# Patient Record
Sex: Female | Born: 1985 | Race: White | Hispanic: No | Marital: Married | Smoking: Never smoker
Health system: Southern US, Community
[De-identification: ages and names within clinical notes are randomized; demographics above are authoritative.]

## PROBLEM LIST (undated history)

## (undated) DIAGNOSIS — N979 Female infertility, unspecified: Secondary | ICD-10-CM

## (undated) DIAGNOSIS — E282 Polycystic ovarian syndrome: Secondary | ICD-10-CM

## (undated) DIAGNOSIS — F419 Anxiety disorder, unspecified: Secondary | ICD-10-CM

## (undated) HISTORY — PX: ANTERIOR CRUCIATE LIGAMENT REPAIR: SHX115

## (undated) HISTORY — PX: NO PAST SURGERIES: SHX2092

---
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
## 2004-02-17 ENCOUNTER — Emergency Department (HOSPITAL_COMMUNITY): Admission: EM | Admit: 2004-02-17 | Discharge: 2004-02-17 | Payer: Self-pay | Admitting: Emergency Medicine

## 2004-03-04 ENCOUNTER — Other Ambulatory Visit: Admission: RE | Admit: 2004-03-04 | Discharge: 2004-03-04 | Payer: Self-pay | Admitting: Obstetrics and Gynecology

## 2005-03-12 ENCOUNTER — Other Ambulatory Visit: Admission: RE | Admit: 2005-03-12 | Discharge: 2005-03-12 | Payer: Self-pay | Admitting: Obstetrics and Gynecology

## 2006-04-08 ENCOUNTER — Inpatient Hospital Stay (HOSPITAL_COMMUNITY): Admission: AD | Admit: 2006-04-08 | Discharge: 2006-04-08 | Payer: Self-pay | Admitting: Obstetrics and Gynecology

## 2006-05-19 ENCOUNTER — Emergency Department (HOSPITAL_COMMUNITY): Admission: EM | Admit: 2006-05-19 | Discharge: 2006-05-19 | Payer: Self-pay | Admitting: Emergency Medicine

## 2007-11-22 ENCOUNTER — Emergency Department (HOSPITAL_COMMUNITY): Admission: EM | Admit: 2007-11-22 | Discharge: 2007-11-22 | Payer: Self-pay | Admitting: Emergency Medicine

## 2009-02-05 ENCOUNTER — Emergency Department (HOSPITAL_COMMUNITY): Admission: EM | Admit: 2009-02-05 | Discharge: 2009-02-05 | Payer: Self-pay | Admitting: Emergency Medicine

## 2010-10-07 LAB — RAPID STREP SCREEN (MED CTR MEBANE ONLY): Streptococcus, Group A Screen (Direct): NEGATIVE

## 2011-03-05 ENCOUNTER — Other Ambulatory Visit (HOSPITAL_COMMUNITY): Payer: Self-pay | Admitting: Obstetrics and Gynecology

## 2011-03-05 DIAGNOSIS — N979 Female infertility, unspecified: Secondary | ICD-10-CM

## 2011-03-11 ENCOUNTER — Ambulatory Visit (HOSPITAL_COMMUNITY): Payer: Self-pay

## 2011-05-06 ENCOUNTER — Ambulatory Visit: Payer: BC Managed Care – PPO

## 2011-05-06 ENCOUNTER — Ambulatory Visit: Payer: BC Managed Care – PPO | Admitting: Family Medicine

## 2011-05-06 VITALS — BP 124/81 | HR 78 | Temp 98.3°F | Resp 16 | Ht 65.5 in | Wt 233.4 lb

## 2011-05-06 DIAGNOSIS — M25529 Pain in unspecified elbow: Secondary | ICD-10-CM

## 2011-05-06 DIAGNOSIS — M25521 Pain in right elbow: Secondary | ICD-10-CM

## 2011-05-06 MED ORDER — MELOXICAM 7.5 MG PO TABS: 15.0000 mg | ORAL_TABLET | Freq: Every day | ORAL | Status: DC

## 2011-05-06 NOTE — Progress Notes (Signed)
  Patient Name: Colleen Weber Date of Birth: 02-29-1985 Medical Record Number: [id number] Gender: female Date of Encounter: 05/06/2011  History of Present Illness:  Colleen Weber is a 26 y.o. very pleasant female patient who presents with the following:  Here with right lateral/ posterior elbow pain for about 3 weeks.  It hurts the most at night.  Flexing the elbow hurts more, and it can "go numb" if she rests the elbow on a counter.  No unusual activities/ exercise program recently, and she does not have small children to carry.  No known injury.  She has tried bio- freeze, tylenol, motrin- have not helped her yet.   LMP started 04/16/11  There is no problem list on file for this patient.  No past medical history on file. No past surgical history on file. History  Substance Use Topics  . Smoking status: Never Smoker   . Smokeless tobacco: Not on file  . Alcohol Use: No   No family history on file. Allergies  Allergen Reactions  . Amoxicillin Anaphylaxis  . Codeine Itching    Medication list has been reviewed and updated.  Review of Systems: As per HPI- otherwise negative.   Physical Examination: Filed Vitals:   05/06/11 1639  BP: 124/81  Pulse: 78  Temp: 98.3 F (36.8 C)  TempSrc: Oral  Resp: 16  Height: 5' 5.5" (1.664 m)  Weight: 233 lb 6.4 oz (105.87 kg)    Body mass index is 38.25 kg/(m^2).  GEN: WDWN, NAD, Non-toxic, A & O x 3, obese HEENT: Atraumatic, Normocephalic. Neck supple. No masses, No LAD. Ears and Nose: No external deformity. CV: RRR, No M/G/R. No JVD. No thrill. No extra heart sounds. PULM: CTA B, no wheezes, crackles, rhonchi. No retractions. No resp. distress. No accessory muscle use EXTR: No c/c/e NEURO Normal gait.  PSYCH: Normally interactive. Conversant. Not depressed or anxious appearing.  Calm demeanor.  Rt elbow:  No tenderness over lateral or medical epicondyle.  Over the posterior elbow there is a small, palpable, mobile nodule  which seems to be attached to a tendon- pressing on this area causes her to feel some pain and tinging going up her arm. No pain with resisted pronation or supination, flexion or extension. No swelling, redness or heat  [facility] reading (PRIMARY) by  Dr. Patsy Lager.  Negative elbow  Assessment and Plan: 1. Right elbow pain  DG Elbow Complete Right, meloxicam (MOBIC) 7.5 MG tablet, Ambulatory referral to Orthopedic Surgery   Elbow pain of uncertain etiology- she may have a formed a nodule on a tendon.  Will refer to ortho for evaluation- appreciate consult.  May use mobic (instructed her one OR two daily) as needed in the meantime

## 2011-05-17 ENCOUNTER — Other Ambulatory Visit (HOSPITAL_COMMUNITY): Payer: Self-pay | Admitting: Obstetrics and Gynecology

## 2011-05-17 DIAGNOSIS — N979 Female infertility, unspecified: Secondary | ICD-10-CM

## 2011-05-24 ENCOUNTER — Ambulatory Visit (HOSPITAL_COMMUNITY)
Admission: RE | Admit: 2011-05-24 | Discharge: 2011-05-24 | Disposition: A | Discharge status: Home / Self Care | Payer: BC Managed Care – PPO | Source: Ambulatory Visit | Attending: Obstetrics and Gynecology | Admitting: Obstetrics and Gynecology

## 2011-05-24 DIAGNOSIS — N979 Female infertility, unspecified: Secondary | ICD-10-CM | POA: Insufficient documentation

## 2011-05-24 MED ORDER — IOHEXOL 300 MG/ML  SOLN: 9.0000 mL | Freq: Once | INTRAMUSCULAR | Status: AC | PRN

## 2012-01-07 ENCOUNTER — Ambulatory Visit: Payer: BC Managed Care – PPO | Admitting: Physician Assistant

## 2012-01-07 VITALS — BP 131/83 | HR 134 | Temp 100.0°F | Resp 16 | Ht 66.0 in | Wt 231.0 lb

## 2012-01-07 DIAGNOSIS — J111 Influenza due to unidentified influenza virus with other respiratory manifestations: Secondary | ICD-10-CM

## 2012-01-07 DIAGNOSIS — R509 Fever, unspecified: Secondary | ICD-10-CM

## 2012-01-07 DIAGNOSIS — R6889 Other general symptoms and signs: Secondary | ICD-10-CM

## 2012-01-07 LAB — POCT CBC
Hemoglobin: 15.6 g/dL (ref 12.2–16.2)
Lymph, poc: 1.3 (ref 0.6–3.4)
MCH, POC: 30.4 pg (ref 27–31.2)
MCHC: 31.8 g/dL (ref 31.8–35.4)
MID (cbc): 1.1 — AB (ref 0–0.9)
MPV: 9 fL (ref 0–99.8)
POC Granulocyte: 7.8 — AB (ref 2–6.9)
POC LYMPH PERCENT: 12.7 %L (ref 10–50)
POC MID %: 10.9 %M (ref 0–12)
Platelet Count, POC: 332 10*3/uL (ref 142–424)
RDW, POC: 14.1 %
WBC: 10.2 10*3/uL (ref 4.6–10.2)

## 2012-01-07 MED ORDER — OSELTAMIVIR PHOSPHATE 75 MG PO CAPS: 75.0000 mg | ORAL_CAPSULE | Freq: Two times a day (BID) | ORAL | Status: DC

## 2012-01-07 MED ORDER — HYDROCODONE-HOMATROPINE 5-1.5 MG/5ML PO SYRP: 5.0000 mL | ORAL_SOLUTION | Freq: Three times a day (TID) | ORAL | Status: DC | PRN

## 2012-01-07 MED ORDER — IBUPROFEN 200 MG PO TABS: 400.0000 mg | ORAL_TABLET | Freq: Once | ORAL | Status: DC

## 2012-01-07 NOTE — Progress Notes (Signed)
Subjective:    Patient ID: Colleen Weber, female    DOB: 10/16/1985, 27 y.o.   MRN: [id number]  HPI   Colleen Weber is a 27 yr old female here with one day of flu-like symptoms.  Began yesterday with cough around lunch time.  Cough worsened throughout the day.  Cough is non-productive.  Felt like she spiked a fever during the night but didn't check her temperature.  Took some ibuprofen which relieved fever.  Still with temp of 100F in clinic.  Denies headache, runny nose, sore throat, or GI symptoms.  But is having body aches.  Her two nieces have been sick.  No flu shot this season.   Review of Systems  Constitutional: Positive for fever and chills.  HENT: Negative for congestion, sore throat, rhinorrhea and sinus pressure.   Respiratory: Positive for cough. Negative for shortness of breath and wheezing.   Cardiovascular: Negative.   Gastrointestinal: Negative.   Musculoskeletal: Positive for arthralgias.  Skin: Negative.   Neurological: Negative for headaches.       Objective:   Physical Exam  Vitals reviewed. Constitutional: She is oriented to person, place, and time. She appears well-developed and well-nourished. No distress.  HENT:  Head: Normocephalic and atraumatic.  Right Ear: Ear canal normal. Tympanic membrane is erythematous.  Left Ear: Tympanic membrane and ear canal normal.  Nose: Nose normal.  Mouth/Throat: Uvula is midline and mucous membranes are normal. Posterior oropharyngeal erythema present.  Eyes: Conjunctivae normal are normal. No scleral icterus.  Neck: Neck supple.  Cardiovascular: Regular rhythm, normal heart sounds and intact distal pulses.  Tachycardia present.  Exam reveals no gallop and no friction rub.   No murmur heard. Pulmonary/Chest: Effort normal and breath sounds normal. She has no wheezes. She has no rales.  Abdominal: Soft. Bowel sounds are normal.  Lymphadenopathy:    She has no cervical adenopathy.  Neurological: She is alert and oriented  to person, place, and time.  Skin: Skin is warm and dry.  Psychiatric: She has a normal mood and affect. Her behavior is normal.      Filed Vitals:   01/07/12 1340  BP: 131/83  Pulse: 134  Temp: 100 F (37.8 C)  Resp: 16      Results for orders placed in visit on 01/07/12  POCT INFLUENZA A/B      Component Value Range   Influenza A, POC Negative     Influenza B, POC Negative    POCT CBC      Component Value Range   WBC 10.2  4.6 - 10.2 K/uL   Lymph, poc 1.3  0.6 - 3.4   POC LYMPH PERCENT 12.7  10 - 50 %L   MID (cbc) 1.1 (*) 0 - 0.9   POC MID % 10.9  0 - 12 %M   POC Granulocyte 7.8 (*) 2 - 6.9   Granulocyte percent 76.4  37 - 80 %G   RBC 5.14  4.04 - 5.48 M/uL   Hemoglobin 15.6  12.2 - 16.2 g/dL   HCT, POC 40.9 (*) 81.1 - 47.9 %   MCV 95.5  80 - 97 fL   MCH, POC 30.4  27 - 31.2 pg   MCHC 31.8  31.8 - 35.4 g/dL   RDW, POC 91.4     Platelet Count, POC 332  142 - 424 K/uL   MPV 9.0  0 - 99.8 fL       Assessment & Plan:   1. Flu-like symptoms  POCT Influenza A/B, POCT CBC, oseltamivir (TAMIFLU) 75 MG capsule, HYDROcodone-homatropine (HYCODAN) 5-1.5 MG/5ML syrup  2. Fever  ibuprofen (ADVIL,MOTRIN) tablet 400 mg     Colleen Weber is a very pleasant 27 yr old female here with influenza-like illness.  Rapid flu is negative.  CBC is normal.  Given that symptoms began yesterday and that this clinically looks like flu, will treat with Tamiflu.  Pt states that Tessalon does not work for her.  States the only thing that works are hydrocodone syrups.  States she does have itching when she uses this but that it is controlled with Benadryl.  States she has never developed hives, swelling of lips or tongue, or difficulty breathing.  Encouraged her to try OTC Robitussin or Delsym first, if this doesn't work, may try Hycodan which has worked well in the past.  Push fluids, plenty of rest.  Pt will RTC if worsening or not improving.

## 2012-01-07 NOTE — Patient Instructions (Addendum)
Begin Tamiflu today.  Be sure to finish the full course.  Plenty of fluids and rest.  Good hand hygiene.  Cover your mouth when coughing or sneezing.  Try Robitussin or Delsym for cough.  If that is not cutting it, use the Hycodan.  If you are having increased itching, difficulty breather, swelling of lips or tongue, STOP the Hycodan immediately.  Let us know if you are worsening or not improving.     Influenza Facts Flu (influenza) is a contagious respiratory illness caused by the influenza viruses. It can cause mild to severe illness. While most healthy people recover from the flu without specific treatment and without complications, older people, young children, and people with certain health conditions are at higher risk for serious complications from the flu, including death. CAUSES   The flu virus is spread from person to person by respiratory droplets from coughing and sneezing.  A person can also become infected by touching an object or surface with a virus on it and then touching their mouth, eye or nose.  Adults may be able to infect others from 1 day before symptoms occur and up to 7 days after getting sick. So it is possible to give someone the flu even before you know you are sick and continue to infect others while you are sick. SYMPTOMS   Fever (usually high).  Headache.  Tiredness (can be extreme).  Cough.  Sore throat.  Runny or stuffy nose.  Body aches.  Diarrhea and vomiting may also occur, particularly in children.  These symptoms are referred to as "flu-like symptoms". A lot of different illnesses, including the common cold, can have similar symptoms. DIAGNOSIS   There are tests that can determine if you have the flu as long you are tested within the first 2 or 3 days of illness.  A doctor's exam and additional tests may be needed to identify if you have a disease that is a complicating the flu. RISKS AND COMPLICATIONS  Some of the complications caused by the  flu include:  Bacterial pneumonia or progressive pneumonia caused by the flu virus.  Loss of body fluids (dehydration).  Worsening of chronic medical conditions, such as heart failure, asthma, or diabetes.  Sinus problems and ear infections. HOME CARE INSTRUCTIONS   Seek medical care early on.  If you are at high risk from complications of the flu, consult your health-care provider as soon as you develop flu-like symptoms. Those at high risk for complications include:  People 65 years or older.  People with chronic medical conditions, including diabetes.  Pregnant women.  Young children.  Your caregiver may recommend use of an antiviral medication to help treat the flu.  If you get the flu, get plenty of rest, drink a lot of liquids, and avoid using alcohol and tobacco.  You can take over-the-counter medications to relieve the symptoms of the flu if your caregiver approves. (Never give aspirin to children or teenagers who have flu-like symptoms, particularly fever). PREVENTION  The single best way to prevent the flu is to get a flu vaccine each fall. Other measures that can help protect against the flu are:  Antiviral Medications  A number of antiviral drugs are approved for use in preventing the flu. These are prescription medications, and a doctor should be consulted before they are used.  Habits for Good Health  Cover your nose and mouth with a tissue when you cough or sneeze, throw the tissue away after you use it.  Wash your hands often with soap and water, especially after you cough or sneeze. If you are not near water, use an alcohol-based hand cleaner.  Avoid people who are sick.  If you get the flu, stay home from work or school. Avoid contact with other people so that you do not make them sick, too.  Try not to touch your eyes, nose, or mouth as germs ore often spread this way. IN CHILDREN, EMERGENCY WARNING SIGNS THAT NEED URGENT MEDICAL ATTENTION:  Fast  breathing or trouble breathing.  Bluish skin color.  Not drinking enough fluids.  Not waking up or not interacting.  Being so irritable that the child does not want to be held.  Flu-like symptoms improve but then return with fever and worse cough.  Fever with a rash. IN ADULTS, EMERGENCY WARNING SIGNS THAT NEED URGENT MEDICAL ATTENTION:  Difficulty breathing or shortness of breath.  Pain or pressure in the chest or abdomen.  Sudden dizziness.  Confusion.  Severe or persistent vomiting. SEEK IMMEDIATE MEDICAL CARE IF:  You or someone you know is experiencing any of the symptoms above. When you arrive at the emergency center,report that you think you have the flu. You may be asked to wear a mask and/or sit in a secluded area to protect others from getting sick. MAKE SURE YOU:   Understand these instructions.  Monitor your condition.  Seek medical care if you are getting worse, or not improving. Document Released: 12/24/2002 Document Revised: 03/15/2011 Document Reviewed: 09/19/2008 [facility] Patient Information 2013 [place], [place].

## 2012-03-18 ENCOUNTER — Emergency Department (HOSPITAL_COMMUNITY)
Admission: EM | Admit: 2012-03-18 | Discharge: 2012-03-18 | Disposition: A | Discharge status: Home / Self Care | Payer: BC Managed Care – PPO | Attending: Emergency Medicine | Admitting: Emergency Medicine

## 2012-03-18 DIAGNOSIS — F411 Generalized anxiety disorder: Secondary | ICD-10-CM | POA: Insufficient documentation

## 2012-03-18 DIAGNOSIS — Z79899 Other long term (current) drug therapy: Secondary | ICD-10-CM | POA: Insufficient documentation

## 2012-03-18 DIAGNOSIS — R0602 Shortness of breath: Secondary | ICD-10-CM | POA: Insufficient documentation

## 2012-03-18 MED ORDER — LORAZEPAM 1 MG PO TABS: 1.0000 mg | ORAL_TABLET | Freq: Three times a day (TID) | ORAL | Status: DC | PRN

## 2012-03-18 MED ORDER — LORAZEPAM 1 MG PO TABS: 2.0000 mg | ORAL_TABLET | Freq: Once | ORAL | Status: DC

## 2012-03-18 MED ORDER — LORAZEPAM 1 MG PO TABS
1.0000 mg | ORAL_TABLET | Freq: Once | ORAL | Status: AC
  Administered 2012-03-18: 1 mg via ORAL
  Filled 2012-03-18: qty 1

## 2012-03-18 NOTE — ED Provider Notes (Signed)
History     CSN: [id number]  Arrival date & time 03/18/12  0215   First MD Initiated Contact with Patient 03/18/12 0243      Chief Complaint  Patient presents with  . Shortness of Breath  . Anxiety    (Consider location/radiation/quality/duration/timing/severity/associated sxs/prior treatment) HPI Patient reports that her brother-in-law who committed suicide 3 weeks ago.  This was unexpected.  The patient was with the deceased the weekend before.  This is very unexpected.  She's had intermittent episodes of shortness of breath since then with the sense that she needs to take a deep breath.  She feels as though she gets anxious and even small things.  She has not met with a psychiatrist or psychologist.  She has no therapist.  No homicidal or suicidal thoughts.  No cough or congestion.  No chest pain.  No lower extremities swelling.  No history of DVT or pulmonary embolism.  Her symptoms are mild to moderate when they occur.  At this time she feels much better. No past medical history on file.  No past surgical history on file.  No family history on file.  History  Substance Use Topics  . Smoking status: Never Smoker   . Smokeless tobacco: Not on file  . Alcohol Use: No    OB History   Grav Para Term Preterm Abortions TAB SAB Ect Mult Living                  Review of Systems  All other systems reviewed and are negative.    Allergies  Amoxicillin and Codeine  Home Medications   Current Outpatient Rx  Name  Route  Sig  Dispense  Refill  . LORazepam (ATIVAN) 1 MG tablet   Oral   Take 1 tablet (1 mg total) by mouth 3 (three) times daily as needed for anxiety.   15 tablet   0   . metFORMIN (GLUCOPHAGE) 500 MG tablet   Oral   Take 500 mg by mouth 2 (two) times daily with a meal.           BP 161/90  Pulse 103  Temp(Src) 98.1 F (36.7 C) (Oral)  Resp 22  SpO2 100%  LMP 03/05/2012  Physical Exam  Nursing note and vitals reviewed. Constitutional: She is  oriented to person, place, and time. She appears well-developed and well-nourished. No distress.  HENT:  Head: Normocephalic and atraumatic.  Eyes: EOM are normal.  Neck: Normal range of motion.  Cardiovascular: Normal rate, regular rhythm and normal heart sounds.   Pulmonary/Chest: Effort normal and breath sounds normal.  Abdominal: Soft. She exhibits no distension. There is no tenderness.  Musculoskeletal: Normal range of motion.  Neurological: She is alert and oriented to person, place, and time.  Skin: Skin is warm and dry.  Psychiatric: Her mood appears anxious.    ED Course  Procedures (including critical care time)  Labs Reviewed - No data to display No results found.   1. Anxiety       MDM  No HI. Pulse ox 100%. Appears to be anxiety. Outpatient resources given        Lyanne Co, MD 03/18/12 [phone]

## 2012-03-18 NOTE — ED Notes (Signed)
Pt states she has been sob for 3 weeks,  Pt states she had a tragic death in her family 3 weeks ago and ever since she has been nervous and sob

## 2012-03-23 ENCOUNTER — Emergency Department (HOSPITAL_COMMUNITY): Payer: BC Managed Care – PPO

## 2012-03-23 ENCOUNTER — Emergency Department (HOSPITAL_COMMUNITY)
Admission: EM | Admit: 2012-03-23 | Discharge: 2012-03-23 | Disposition: A | Discharge status: Home / Self Care | Payer: BC Managed Care – PPO | Attending: Emergency Medicine | Admitting: Emergency Medicine

## 2012-03-23 ENCOUNTER — Encounter (HOSPITAL_COMMUNITY): Payer: Self-pay | Admitting: *Deleted

## 2012-03-23 DIAGNOSIS — R42 Dizziness and giddiness: Secondary | ICD-10-CM | POA: Insufficient documentation

## 2012-03-23 DIAGNOSIS — Z79899 Other long term (current) drug therapy: Secondary | ICD-10-CM | POA: Insufficient documentation

## 2012-03-23 DIAGNOSIS — F411 Generalized anxiety disorder: Secondary | ICD-10-CM | POA: Insufficient documentation

## 2012-03-23 DIAGNOSIS — R209 Unspecified disturbances of skin sensation: Secondary | ICD-10-CM | POA: Insufficient documentation

## 2012-03-23 HISTORY — DX: Polycystic ovarian syndrome: E28.2

## 2012-03-23 LAB — COMPREHENSIVE METABOLIC PANEL
Albumin: 3.8 g/dL (ref 3.5–5.2)
BUN: 9 mg/dL (ref 6–23)
Creatinine, Ser: 0.65 mg/dL (ref 0.50–1.10)
Total Protein: 7.5 g/dL (ref 6.0–8.3)

## 2012-03-23 LAB — POCT I-STAT TROPONIN I: Troponin i, poc: 0 ng/mL (ref 0.00–0.08)

## 2012-03-23 LAB — CBC
HCT: 42.4 % (ref 36.0–46.0)
MCHC: 34.4 g/dL (ref 30.0–36.0)
MCV: 88.7 fL (ref 78.0–100.0)
RDW: 12.6 % (ref 11.5–15.5)

## 2012-03-23 NOTE — ED Notes (Signed)
Chest view clicked off in error.

## 2012-03-23 NOTE — ED Notes (Signed)
Pt's brother-in-law committed suicide 1 month prior.  Last Friday she went to [facility] b/c she felt like she couldn't breath, felt dizzy and experienced L arm numbness.  They gave her a few pills, and told her it was anxiety.  She has been experiencing the same s/s and wants to be sure that it's not her heart b/c her dad had to have a pace maker when he was young.

## 2012-03-23 NOTE — ED Provider Notes (Signed)
History     CSN: [id number]  Arrival date & time 03/23/12  1027   First MD Initiated Contact with Patient 03/23/12 1203      Chief Complaint  Patient presents with  . Numbness  . Shortness of Breath    (Consider location/radiation/quality/duration/timing/severity/associated sxs/prior treatment) Patient is a 27 y.o. female presenting with shortness of breath.  Shortness of Breath  Pt with no significant PMH was seen about 5 days ago at [facility] for symptoms consistent with panic attack. She was given Ativan Rx. She reports today while working out at [employer], she began to feel the same symptoms but less severe. She describes SOB, arm tingling, and mild dizziness. She is feeling better now. She came to the ED for re-evaluation to make sure these symptoms were not something more serious. No recent travel, no leg swelling. No other PE risk factors.   History reviewed. No pertinent past medical history.  History reviewed. No pertinent past surgical history.  History reviewed. No pertinent family history.  History  Substance Use Topics  . Smoking status: Never Smoker   . Smokeless tobacco: Not on file  . Alcohol Use: No    OB History   Grav Para Term Preterm Abortions TAB SAB Ect Mult Living                  Review of Systems  Respiratory: Positive for shortness of breath.    All other systems reviewed and are negative except as noted in HPI.   Allergies  Amoxicillin and Codeine  Home Medications   Current Outpatient Rx  Name  Route  Sig  Dispense  Refill  . LORazepam (ATIVAN) 1 MG tablet   Oral   Take 1 tablet (1 mg total) by mouth 3 (three) times daily as needed for anxiety.   15 tablet   0   . metFORMIN (GLUCOPHAGE) 500 MG tablet   Oral   Take 500 mg by mouth 2 (two) times daily with a meal.           BP 140/90  Pulse 128  Temp(Src) 98.6 F (37 C) (Oral)  SpO2 96%  LMP 03/13/2012  Physical Exam  Nursing note and vitals reviewed. Constitutional: She  is oriented to person, place, and time. She appears well-developed and well-nourished.  HENT:  Head: Normocephalic and atraumatic.  Eyes: EOM are normal. Pupils are equal, round, and reactive to light.  Neck: Normal range of motion. Neck supple.  Cardiovascular: Normal rate, normal heart sounds and intact distal pulses.   Pulmonary/Chest: Effort normal and breath sounds normal.  Abdominal: Bowel sounds are normal. She exhibits no distension. There is no tenderness.  Musculoskeletal: Normal range of motion. She exhibits no edema and no tenderness.  Neurological: She is alert and oriented to person, place, and time. She has normal strength. No cranial nerve deficit or sensory deficit.  Skin: Skin is warm and dry. No rash noted.  Psychiatric: She has a normal mood and affect.    ED Course  Procedures (including critical care time)  Labs Reviewed  COMPREHENSIVE METABOLIC PANEL - Abnormal; Notable for the following:    Glucose, Bld 102 (*)    All other components within normal limits  CBC  POCT I-STAT TROPONIN I   Dg Chest 2 View  03/23/2012  *RADIOLOGY REPORT*  Clinical Data: Shortness of breath.  CHEST - 2 VIEW  Comparison: None  Findings: The cardiac silhouette, mediastinal and hilar contours are within normal limits.  Low  lung volumes with vascular crowding and streaky basilar atelectasis.  No infiltrates, edema or effusions.  IMPRESSION: Low lung volumes with vascular crowding and streaky bibasilar atelectasis. No definite infiltrates.   Original Report Authenticated By: Rudie Meyer, M.D.      1. Anxiety       MDM   Date: 03/23/2012  Rate: 106  Rhythm:  sinus tachycardia  QRS Axis: normal  Intervals: normal  ST/T Wave abnormalities: normal  Conduction Disutrbances: none  Narrative Interpretation: unremarkable  Pt feeling better at the time of my eval. No longer tachycardic. Doubt CAD or PE. Labs and imaging ordered in triage are pending. Will wait for results.   12:45  PM Labs and imaging normal. Advised to continue ativan as needed. PCP followup.        Charles B. Bernette Mayers, MD 03/23/12 1245

## 2012-03-23 NOTE — ED Notes (Addendum)
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 Patient is sitting on stretcher in no distress at this time. Patient states her father was born with a heart defect and had a pacemaker at an early age.

## 2012-04-23 ENCOUNTER — Encounter (HOSPITAL_COMMUNITY): Payer: Self-pay | Admitting: Emergency Medicine

## 2012-04-23 ENCOUNTER — Emergency Department (HOSPITAL_COMMUNITY): Payer: BC Managed Care – PPO

## 2012-04-23 ENCOUNTER — Emergency Department (HOSPITAL_COMMUNITY)
Admission: EM | Admit: 2012-04-23 | Discharge: 2012-04-23 | Disposition: A | Discharge status: Home / Self Care | Payer: BC Managed Care – PPO | Attending: Emergency Medicine | Admitting: Emergency Medicine

## 2012-04-23 DIAGNOSIS — F419 Anxiety disorder, unspecified: Secondary | ICD-10-CM

## 2012-04-23 DIAGNOSIS — Z8742 Personal history of other diseases of the female genital tract: Secondary | ICD-10-CM | POA: Insufficient documentation

## 2012-04-23 DIAGNOSIS — R000 Tachycardia, unspecified: Secondary | ICD-10-CM | POA: Insufficient documentation

## 2012-04-23 DIAGNOSIS — Z7982 Long term (current) use of aspirin: Secondary | ICD-10-CM | POA: Insufficient documentation

## 2012-04-23 DIAGNOSIS — Z79899 Other long term (current) drug therapy: Secondary | ICD-10-CM | POA: Insufficient documentation

## 2012-04-23 DIAGNOSIS — R071 Chest pain on breathing: Secondary | ICD-10-CM | POA: Insufficient documentation

## 2012-04-23 DIAGNOSIS — F411 Generalized anxiety disorder: Secondary | ICD-10-CM | POA: Insufficient documentation

## 2012-04-23 DIAGNOSIS — R0789 Other chest pain: Secondary | ICD-10-CM

## 2012-04-23 DIAGNOSIS — R002 Palpitations: Secondary | ICD-10-CM

## 2012-04-23 HISTORY — DX: Anxiety disorder, unspecified: F41.9

## 2012-04-23 LAB — POCT I-STAT TROPONIN I: Troponin i, poc: 0 ng/mL (ref 0.00–0.08)

## 2012-04-23 LAB — COMPREHENSIVE METABOLIC PANEL
AST: 26 U/L (ref 0–37)
BUN: 10 mg/dL (ref 6–23)
CO2: 28 mEq/L (ref 19–32)
Calcium: 9.4 mg/dL (ref 8.4–10.5)
Creatinine, Ser: 0.77 mg/dL (ref 0.50–1.10)
GFR calc non Af Amer: >90 mL/min (ref >90)

## 2012-04-23 LAB — CBC
Hemoglobin: 14.5 g/dL (ref 12.0–15.0)
MCHC: 35.6 g/dL (ref 30.0–36.0)
RBC: 4.59 MIL/uL (ref 3.87–5.11)

## 2012-04-23 LAB — TSH: TSH: 1.195 u[IU]/mL (ref 0.350–4.500)

## 2012-04-23 LAB — MAGNESIUM: Magnesium: 1.9 mg/dL (ref 1.5–2.5)

## 2012-04-23 MED ORDER — IBUPROFEN 600 MG PO TABS: 600.0000 mg | ORAL_TABLET | Freq: Four times a day (QID) | ORAL | Status: DC | PRN

## 2012-04-23 MED ORDER — KETOROLAC TROMETHAMINE 30 MG/ML IJ SOLN
30.0000 mg | Freq: Once | INTRAMUSCULAR | Status: AC
  Administered 2012-04-23: 30 mg via INTRAVENOUS
  Filled 2012-04-23: qty 1

## 2012-04-23 MED ORDER — METHOCARBAMOL 500 MG PO TABS: 1000.0000 mg | ORAL_TABLET | Freq: Four times a day (QID) | ORAL | Status: DC | PRN

## 2012-04-23 MED ORDER — LORAZEPAM 2 MG/ML IJ SOLN
1.0000 mg | Freq: Once | INTRAMUSCULAR | Status: AC
Start: 2012-04-23 — End: 2012-04-23
  Administered 2012-04-23: 1 mg via INTRAVENOUS
  Filled 2012-04-23: qty 1

## 2012-04-23 NOTE — ED Provider Notes (Signed)
History     CSN: [id number]  Arrival date & time 04/23/12  1637   First MD Initiated Contact with Patient 04/23/12 1655      Chief Complaint  Patient presents with  . Chest Pain    (Consider location/radiation/quality/duration/timing/severity/associated sxs/prior treatment) HPI  Patient reports several deaths in her family in the past year. Most recently within the past 2 months her husband's brother died. Husband reports he has sleep apnea and sometimes during the night he stops breathing briefly. He reports his wife does not sleep because she is worried something is going to happen to him. She reports today she has had stress over the weekend concerning taking care of 2 small nieces and having company today for Easter. She reports she has been walking up and down the steps at her house frequently today. She states 30 minutes prior to arrival she started getting a discomfort in her right chest with breathing. She describes it as aching and states it comes and goes. She states she feels like her heart is racing and when it races she feels mildly short of breath. She also feels very anxious. She reports her anxiety gets worse at night. She also notices her heart racing when she gets very active. She reports she's been seen for this before in March. On review of her ED record she was seen here March 15 and again March 20 for similar symptoms. She reports that her doctor has been putting her on Ativan but is only giving her 15 tablets all at a time because she was told it was addictive. She states she tries to take it at night to sleep and she also takes a half a tablet during the day for anxiety. She has never been evaluated by psychiatrist or treated for anxiety.   PCP Dr Wynelle Link  Past Medical History  Diagnosis Date  . Polycystic ovarian syndrome   . Anxiety     History reviewed. No pertinent past surgical history.  History reviewed. No pertinent family history. FOP pacemaker at age 4  yo  History  Substance Use Topics  . Smoking status: Never Smoker   . Smokeless tobacco: Not on file  . Alcohol Use: No  lives at home Lives with spouse Stays at home  OB History   Grav Para Term Preterm Abortions TAB SAB Ect Mult Living                  Review of Systems  All other systems reviewed and are negative.    Allergies  Amoxicillin and Codeine  Home Medications   Current Outpatient Rx  Name  Route  Sig  Dispense  Refill  . aspirin 81 MG chewable tablet   Oral   Chew 81 mg by mouth daily.         . Calcium Carb-Cholecalciferol (CALCIUM + D3 PO)   Oral   Take 2 each by mouth daily. Vitamin shoppe brand chews         . LORazepam (ATIVAN) 1 MG tablet   Oral   Take 1 mg by mouth 3 (three) times daily as needed for anxiety.         Marland Kitchen MAGNESIUM-ZINC PO   Oral   Take 3 tablets by mouth daily. RDA recommended dosing         . Multiple Vitamin (MULTIVITAMIN WITH MINERALS) TABS   Oral   Take 1 tablet by mouth daily.           BP  144/77  Pulse 131  Temp(Src) 98.1 F (36.7 C) (Oral)  Resp 13  SpO2 97%  Vital signs normal except tachycardia   Physical Exam  Nursing note and vitals reviewed. Constitutional: She is oriented to person, place, and time. She appears well-developed and well-nourished.  Non-toxic appearance. She does not appear ill. No distress.  HENT:  Head: Normocephalic and atraumatic.  Right Ear: External ear normal.  Left Ear: External ear normal.  Nose: Nose normal. No mucosal edema or rhinorrhea.  Mouth/Throat: Oropharynx is clear and moist and mucous membranes are normal. No dental abscesses or edematous.  Eyes: Conjunctivae and EOM are normal. Pupils are equal, round, and reactive to light.  Neck: Normal range of motion and full passive range of motion without pain. Neck supple.  Cardiovascular: Regular rhythm and normal heart sounds.  Tachycardia present.  Exam reveals no gallop and no friction rub.   No murmur  heard. Pulmonary/Chest: Effort normal and breath sounds normal. No respiratory distress. She has no wheezes. She has no rhonchi. She has no rales. She exhibits no tenderness and no crepitus.    Area of pain noted  Abdominal: Soft. Normal appearance and bowel sounds are normal. She exhibits no distension. There is no tenderness. There is no rebound and no guarding.  Musculoskeletal: Normal range of motion. She exhibits no edema and no tenderness.  Moves all extremities well.   Neurological: She is alert and oriented to person, place, and time. She has normal strength. No cranial nerve deficit.  Skin: Skin is warm, dry and intact. No rash noted. No erythema. No pallor.  Psychiatric: Her speech is normal and behavior is normal. Her mood appears not anxious.  anxious    ED Course  Procedures (including critical care time)  Medications  ketorolac (TORADOL) 30 MG/ML injection 30 mg (30 mg Intravenous Given 04/23/12 1752)  LORazepam (ATIVAN) injection 1 mg (1 mg Intravenous Given 04/23/12 1754)   Pt states her pain is better and she feels better.   We discussed having her doctors check her thyroid test results later this week.  We also discussed seeing a psychiatrist to get better control of her anxiety.  She states the ativan makes her feel drugged over in the morning if she takes it at bedtime.   Results for orders placed during the hospital encounter of 04/23/12  CBC      Result Value Range   WBC 8.5  4.0 - 10.5 K/uL   RBC 4.59  3.87 - 5.11 MIL/uL   Hemoglobin 14.5  12.0 - 15.0 g/dL   HCT 30.8  65.7 - 84.6 %   MCV 88.7  78.0 - 100.0 fL   MCH 31.6  26.0 - 34.0 pg   MCHC 35.6  30.0 - 36.0 g/dL   RDW 96.2  95.2 - 84.1 %   Platelets 260  150 - 400 K/uL  MAGNESIUM      Result Value Range   Magnesium 1.9  1.5 - 2.5 mg/dL  TSH      Result Value Range   TSH 1.195  0.350 - 4.500 uIU/mL  T4, FREE      Result Value Range   Free T4 0.89  0.80 - 1.80 ng/dL  COMPREHENSIVE METABOLIC PANEL       Result Value Range   Sodium 136  135 - 145 mEq/L   Potassium 3.7  3.5 - 5.1 mEq/L   Chloride 100  96 - 112 mEq/L   CO2 28  19 - 32  mEq/L   Glucose, Bld 110 (*) 70 - 99 mg/dL   BUN 10  6 - 23 mg/dL   Creatinine, Ser 3.24  0.50 - 1.10 mg/dL   Calcium 9.4  8.4 - 40.1 mg/dL   Total Protein 6.8  6.0 - 8.3 g/dL   Albumin 3.5  3.5 - 5.2 g/dL   AST 26  0 - 37 U/L   ALT 18  0 - 35 U/L   Alkaline Phosphatase 85  39 - 117 U/L   Total Bilirubin 0.2 (*) 0.3 - 1.2 mg/dL   GFR calc non Af Amer >90  >90 mL/min   GFR calc Af Amer >90  >90 mL/min  POCT I-STAT TROPONIN I      Result Value Range   Troponin i, poc 0.00  0.00 - 0.08 ng/mL   Comment 3            Laboratory interpretation all normal except thyroid tests pending     Dg Chest 2 View  04/23/2012  *RADIOLOGY REPORT*  Clinical Data: Chest pain.  CHEST - 2 VIEW  Comparison: 03/23/2012.  Findings: The cardiac silhouette, mediastinal and hilar contours are normal and stable.  The lungs are clear.  No pleural effusion. The bony thorax is intact.  IMPRESSION: No acute cardiopulmonary findings.   Original Report Authenticated By: Rudie Meyer, M.D.       Date: 04/23/2012  Rate: 109  Rhythm: sinus tachycardia  QRS Axis: normal  Intervals: normal  ST/T Wave abnormalities: normal  Conduction Disutrbances:none  Narrative Interpretation:   Old EKG Reviewed: unchanged from 03/23/2012    1. Chest wall pain   2. Palpitations   3. Anxiety   4. Tachycardia     Discharge Medication List as of 04/23/2012  8:32 PM    START taking these medications   Details  ibuprofen (ADVIL,MOTRIN) 600 MG tablet Take 1 tablet (600 mg total) by mouth every 6 (six) hours as needed for pain., Starting 04/23/2012, Until Discontinued, Print    methocarbamol (ROBAXIN) 500 MG tablet Take 2 tablets (1,000 mg total) by mouth 4 (four) times daily as needed (chest wall pain)., Starting 04/23/2012, Until Discontinued, Print        Plan discharge  Devoria Albe, MD,  FACEP   MDM          Ward Givens, MD 04/23/12 [phone]

## 2012-04-23 NOTE — ED Notes (Signed)
MD Knapp at bedside 

## 2012-04-23 NOTE — ED Notes (Signed)
Pt c/o right sided CP with SOB starting 30 min ago; pt sts hx of anxiety and feels similar

## 2012-05-12 ENCOUNTER — Encounter (HOSPITAL_COMMUNITY): Payer: Self-pay | Admitting: Psychiatry

## 2012-05-12 ENCOUNTER — Ambulatory Visit (INDEPENDENT_AMBULATORY_CARE_PROVIDER_SITE_OTHER): Payer: BC Managed Care – PPO | Admitting: Psychiatry

## 2012-05-12 VITALS — BP 131/86 | HR 77 | Wt 232.0 lb

## 2012-05-12 DIAGNOSIS — E282 Polycystic ovarian syndrome: Secondary | ICD-10-CM

## 2012-05-12 DIAGNOSIS — F419 Anxiety disorder, unspecified: Secondary | ICD-10-CM

## 2012-05-12 DIAGNOSIS — F411 Generalized anxiety disorder: Secondary | ICD-10-CM

## 2012-05-12 MED ORDER — LORAZEPAM 0.5 MG PO TABS: 0.5000 mg | ORAL_TABLET | ORAL | Status: DC | PRN

## 2012-05-12 MED ORDER — FLUOXETINE HCL 10 MG PO CAPS: 10.0000 mg | ORAL_CAPSULE | Freq: Every day | ORAL | Status: DC

## 2012-05-12 NOTE — Progress Notes (Addendum)
Patient ID: Colleen Weber, female   DOB: 03/20/1985, 27 y.o.   MRN: [id number] Psychiatry Assessment Note Chief complaint Anxiety and panic attack.  History of present illness. Is 27 year old Caucasian married unemployed female self referred for seeking treatment of her panic attack and anxiety symptoms.  Patient admitted increase nervousness, panic attack and fear of dying for past few months.  Patient told last 04-24-2022 her cousin died when she was staying with her for vacation. She died due to status epilepticus and patient found her in the sleep.  Patient developed some anxiety and nervousness since then.  She endorse seeing her dead body, images and having vivid dreams however she started to feel better until recently she experience another loss.  Her husband's brother died in Feb 23, 2022.  He committed suicide.  Patient started to have these symptoms more intense and frequent.  She is having panic attack at least 2-3 times a week.  She felt that something is going to happen to her husband.  She's been sleeping only a few hours.  She is waking up in the middle of the night to check if his husband is breathing.  She also admitted crying spells, decreased energy, some mood swings and very emotional.  She started to feel very anxious around crowds.  This easter she had a lot of family members and patient decided to have panic attack and very nervous.  She's been in the emergency room at least 3 times since 2022-02-23.  Most of the time is due to chest pain panic attack and fear that she is going to die.  Her troponin level is okay, her basic CBC and CMP is also normal.  She was given Ativan 1 mg by primary care physician which she takes only as needed.  Patient is very concerned about psychotropic medication.  She does not want to get addicted.  However she felt that Ativan does help.  Patient also has polycystic ovary and she consider herself very emotional and hormonal.   She admitted easily tearful and crying  when she is watching any romantic or sad movie.  She is concerned about her physical health and her husband's life.  She admitted that sometimes angry irritable but denies any violence or aggression.  She denies any active or passive suicidal thoughts.  She denies any recent change in her weight or appetite.  She denies any paranoia or any hallucination.  She denies any PTSD symptoms.  She denies any obsessive-compulsive symptoms. She is not scared to fly however she does not like to fly because she afraid the plane will crash.  She also does not like to swim in the ocean because her cousin drown when she was only 67 years old.  Patient is not taking any antidepressant or antianxiety medication at this time.  Patient endorse or panic attack comes out of the blue and he stays at least 10 minutes.  She usually take deep breathing or Ativan to calm down.  Patient is not seeing any therapist.  Past psychiatric history. Patient has no previous history of psychiatric inpatient treatment or outpatient treatment.  She denied any history of suicidal attempt, paranoia, psychosis or mania.  Patient endorse anxiety since her cousin died due to status epilepticus and patient found her at her place.  History of abuse. She denies any history of physical sexual verbal or emotional abuse.  Family history. Patient endorse father has anxiety and a younger brother has anxiety and depression.  Medical history. Patient is obese  and diagnosed with polycystic ovary disease.  She's taking metformin and multivitamins.  Her OB/GYN is Dr. Arelia Sneddon and her primary care physician is Dr. son.  Her recent blood work while she visited in the emergency room on April 10 was normal.  Patient denies any history of loss of consciousness, TBI, seizures or any head injury.  Psychosocial history. Patient is born and raised in [place].  Her mother lives in [place] and her father lives in [place].  Her parents never  married.  Patient married twice.  Her first marriage ended when her husband cheated her.  She's living with her husband, aunt and grandparents.  She has no children.  Patient is very close to her mother and father and grandparents.  Education and work history.  Patient has high school education.  She's currently not working.  Alcohol and substance use history. Patient denies any history of any illegal substance use.  She drinks alcohol on rare occasions.  She has no history of DWI, tremors shakes withdrawal symptoms or any blackouts.  She does not smoke.  In the past she has smoked marijuana and heavy drinking when she was going through divorce however she stopped after 4 weeks of divorce.  Legal history. The patient has no legal issues.  Review of Systems  Constitutional:       Obesity  HENT: Negative.   Eyes: Negative.   Respiratory: Negative.   Cardiovascular: Positive for palpitations.  Musculoskeletal: Negative.   Neurological: Negative.   Psychiatric/Behavioral: Positive for depression. Negative for suicidal ideas, memory loss and substance abuse. The patient is nervous/anxious and has insomnia.      Mental status examination Patient is a morbidly obese female who is casually dressed and well-groomed.  She is anxious and emotional but relevant in conversation.  Her speech is soft and clear and coherent.  Her thought processes slow but logical linear and goal-directed.  Her attention and concentration is fair.  She described her mood as anxious and nervous and her affect is mood appropriate.  She denies any active or passive suicidal thoughts or homicidal thoughts.  She denies any auditory or visual hallucination.  There were no paranoia or delusion obsession present at this time.  Her psychomotor activity is slightly increased.  She has no tremors or shakes.  Her fund of knowledge is adequate.  She's alert and oriented x3.  Her insight judgment and impulse control is  okay.  Assessment Axis I anxiety disorder NOS, rule out panic disorder Axis II deferred Axis III see medical history Axis IV moderate Axis V 60-65  Plan I review her symptoms, history, current medication and blood results.  I talked at length about starting antianxiety medication to prevent these anxiety attacks and panic attacks.  Patient raised the concern and trying to get pregnant and seeking actively treatment from her OB/GYN.  I discussed in length about teratogenic side effects of medication.  She wants to try medication and I suggested to try Prozac 10 mg which is relatively safe in pregnancy.  However I strongly encouraged her to discuss with her OB/GYN before she started the medication.  We also talk about Ativan which will not be a good choice if she gets pregnant.  Patient currently not pregnant however she like to come off from Ativan once she gets pregnant.  She does not want to use Ativan every day.  However she felt when she takes the Ativan it helps anxiety.  I suggested to take Ativan 0.5  mg rather than 1 mg for episodic anxiety and panic attack.  I suggested take Prozac 10 mg and contact her OB/GYN if she gets pregnant.  I explain in detail the risk and benefits of medication.  We talked about safety plan anytime having active suicidal thoughts or homicidal thoughts continue to call 911 or go to local emergency room.  I would also refer her to [facility] for counseling.  Patient agree with the plan.  I will see him again in 3 weeks.  Time spent 60 minutes.

## 2012-05-18 ENCOUNTER — Encounter (HOSPITAL_COMMUNITY): Payer: Self-pay | Admitting: *Deleted

## 2012-05-18 ENCOUNTER — Emergency Department (HOSPITAL_COMMUNITY): Payer: BC Managed Care – PPO

## 2012-05-18 ENCOUNTER — Emergency Department (HOSPITAL_COMMUNITY)
Admission: EM | Admit: 2012-05-18 | Discharge: 2012-05-19 | Disposition: A | Discharge status: Home / Self Care | Payer: BC Managed Care – PPO | Attending: Emergency Medicine | Admitting: Emergency Medicine

## 2012-05-18 DIAGNOSIS — Z7982 Long term (current) use of aspirin: Secondary | ICD-10-CM | POA: Insufficient documentation

## 2012-05-18 DIAGNOSIS — R002 Palpitations: Secondary | ICD-10-CM

## 2012-05-18 DIAGNOSIS — Z79899 Other long term (current) drug therapy: Secondary | ICD-10-CM | POA: Insufficient documentation

## 2012-05-18 DIAGNOSIS — F411 Generalized anxiety disorder: Secondary | ICD-10-CM | POA: Insufficient documentation

## 2012-05-18 DIAGNOSIS — Z3202 Encounter for pregnancy test, result negative: Secondary | ICD-10-CM | POA: Insufficient documentation

## 2012-05-18 DIAGNOSIS — Z862 Personal history of diseases of the blood and blood-forming organs and certain disorders involving the immune mechanism: Secondary | ICD-10-CM | POA: Insufficient documentation

## 2012-05-18 DIAGNOSIS — Z8639 Personal history of other endocrine, nutritional and metabolic disease: Secondary | ICD-10-CM | POA: Insufficient documentation

## 2012-05-18 LAB — POCT I-STAT TROPONIN I: Troponin i, poc: 0.01 ng/mL (ref 0.00–0.08)

## 2012-05-18 LAB — CBC
MCH: 32 pg (ref 26.0–34.0)
MCHC: 36.3 g/dL — ABNORMAL HIGH (ref 30.0–36.0)
Platelets: 311 10*3/uL (ref 150–400)

## 2012-05-18 LAB — BASIC METABOLIC PANEL
Calcium: 9.7 mg/dL (ref 8.4–10.5)
GFR calc non Af Amer: >90 mL/min (ref >90)
Sodium: 137 mEq/L (ref 135–145)

## 2012-05-18 NOTE — ED Notes (Addendum)
Cold sensation started on rt. Side of chest, and radiating to lt. Side of upper chest. Pt. Is under a lot of stress. Few mos. Ago family crisis. Before sensation, pt. Had sm. Cp. No sob, no diaphoresis; some tightness. Took 0.5 mg of ativan x 2 pills prior to arrival.

## 2012-05-18 NOTE — ED Notes (Addendum)
Pt reports onset of palpitations with cold senstation in chest began at approx 2130

## 2012-05-19 NOTE — ED Provider Notes (Signed)
History     CSN: [id number]  Arrival date & time 05/18/12  2233   First MD Initiated Contact with Patient 05/18/12 2259      Chief Complaint  Patient presents with  . Palpitations  . Chest Pain    (Consider location/radiation/quality/duration/timing/severity/associated sxs/prior treatment) HPI 27 year old female presents to emergency apartment with complaint of palpitations and cold sensation in the top of her chest.  Patient reports her symptoms started around 9:30 tonight, as she was laying down in bed.  Patient started with a chest discomfort, initially, followed by palpitations, and then a very cold sensation across the top of her chest.  Patient has history of anxiety, she is currently on Xanax, and Prozac.  She's been on Prozac for 2 weeks.  Patient reports she is having some anxiety associated with her palpitations, but does not feel that she is having a full-blown panic attack.  No fevers no chills.  No cough no shortness of breath.  No leg swelling.  Patient has had 3 prior evaluations in the emergency department for chest symptoms and palpitations each time.  Patient has presented to the emergency room with tachycardia.  She denies any ingestions or any other new medications.  Past Medical History  Diagnosis Date  . Polycystic ovarian syndrome   . Anxiety     History reviewed. No pertinent past surgical history.  Family History  Problem Relation Age of Onset  . Anxiety disorder Mother   . Anxiety disorder Brother     History  Substance Use Topics  . Smoking status: Never Smoker   . Smokeless tobacco: Not on file  . Alcohol Use: No    OB History   Grav Para Term Preterm Abortions TAB SAB Ect Mult Living                  Review of Systems  All other systems reviewed and are negative.   other than listed in history of present illness  Allergies  Amoxicillin and Codeine  Home Medications   Current Outpatient Rx  Name  Route  Sig  Dispense  Refill  .  aspirin 81 MG chewable tablet   Oral   Chew 81 mg by mouth daily.         . Calcium Carb-Cholecalciferol (CALCIUM + D3 PO)   Oral   Take 2 tablets by mouth daily. Vitamin shoppe brand chews         . diphenhydrAMINE (BENADRYL) 25 mg capsule   Oral   Take 50 mg by mouth every 6 (six) hours as needed for itching.         Marland Kitchen FLUoxetine (PROZAC) 10 MG capsule   Oral   Take 1 capsule (10 mg total) by mouth daily.   30 capsule   0   . ibuprofen (ADVIL,MOTRIN) 600 MG tablet   Oral   Take 1 tablet (600 mg total) by mouth every 6 (six) hours as needed for pain.   40 tablet   0   . LORazepam (ATIVAN) 0.5 MG tablet   Oral   Take 0.5 mg by mouth daily as needed for anxiety.         Marland Kitchen MAGNESIUM-ZINC PO   Oral   Take 3 tablets by mouth daily. RDA recommended dosing         . metFORMIN (GLUCOPHAGE) 500 MG tablet   Oral   Take 500 mg by mouth daily with breakfast.          . Multiple  Vitamin (MULTIVITAMIN WITH MINERALS) TABS   Oral   Take 1 tablet by mouth daily.           BP 148/91  Pulse 125  Temp(Src) 98.4 F (36.9 C) (Oral)  SpO2 99%  LMP 05/01/2012  Physical Exam  Nursing note and vitals reviewed. Constitutional: She is oriented to person, place, and time. She appears well-developed and well-nourished. No distress.  HENT:  Head: Normocephalic and atraumatic.  Nose: Nose normal.  Mouth/Throat: Oropharynx is clear and moist.  Eyes: Conjunctivae and EOM are normal. Pupils are equal, round, and reactive to light.  Neck: Normal range of motion. Neck supple. No JVD present. No tracheal deviation present. No thyromegaly present.  Cardiovascular: Regular rhythm, normal heart sounds and intact distal pulses.  Exam reveals no gallop and no friction rub.   No murmur heard. Tachycardia  Pulmonary/Chest: Effort normal and breath sounds normal. No stridor. No respiratory distress. She has no wheezes. She has no rales. She exhibits no tenderness.  Abdominal: Soft.  Bowel sounds are normal. She exhibits no distension and no mass. There is no tenderness. There is no rebound and no guarding.  Musculoskeletal: Normal range of motion. She exhibits no edema and no tenderness.  Lymphadenopathy:    She has no cervical adenopathy.  Neurological: She is alert and oriented to person, place, and time. She exhibits normal muscle tone. Coordination normal.  Skin: Skin is warm and dry. No rash noted. She is not diaphoretic. No erythema. No pallor.  Psychiatric: She has a normal mood and affect. Her behavior is normal. Judgment and thought content normal.    ED Course  Procedures (including critical care time)  Labs Reviewed  CBC - Abnormal; Notable for the following:    WBC 12.7 (*)    Hemoglobin 15.4 (*)    MCHC 36.3 (*)    All other components within normal limits  BASIC METABOLIC PANEL - Abnormal; Notable for the following:    Glucose, Bld 114 (*)    All other components within normal limits  POCT I-STAT TROPONIN I  POCT PREGNANCY, URINE   Dg Chest 2 View  05/18/2012   *RADIOLOGY REPORT*  Clinical Data: Palpitations, chest pain  CHEST - 2 VIEW  Comparison: 04/23/2012  Findings: Lungs are clear. No pleural effusion or pneumothorax. The cardiomediastinal contours are within normal limits. The visualized bones and soft tissues are without significant appreciable abnormality.  IMPRESSION: No radiographic evidence of acute cardiopulmonary process.   Original Report Authenticated By: Jearld Lesch, M.D.    Date: 05/19/2012  Rate: 123  Rhythm: sinus tachycardia  QRS Axis: normal  Intervals: normal  ST/T Wave abnormalities: normal  Conduction Disutrbances:none  Narrative Interpretation:   Old EKG Reviewed: unchanged    1. Rapid palpitations       MDM  27 year old female with cold sensation to the upper part of her chest along with palpitations.  Her workup here is been unremarkable.  I do not have a good explanation for her cold sensation.  We'll  refer her back to her primary care Dr. for further management      [name], MD 05/19/12 [phone]

## 2012-05-19 NOTE — ED Notes (Signed)
PT comfortable with d/c and f/u instructions.

## 2012-06-02 ENCOUNTER — Encounter (HOSPITAL_COMMUNITY): Payer: Self-pay | Admitting: Psychiatry

## 2012-06-02 ENCOUNTER — Ambulatory Visit (INDEPENDENT_AMBULATORY_CARE_PROVIDER_SITE_OTHER): Payer: BC Managed Care – PPO | Admitting: Psychiatry

## 2012-06-02 VITALS — BP 130/90 | HR 75 | Ht 66.0 in | Wt 232.0 lb

## 2012-06-02 DIAGNOSIS — F419 Anxiety disorder, unspecified: Secondary | ICD-10-CM

## 2012-06-02 DIAGNOSIS — F411 Generalized anxiety disorder: Secondary | ICD-10-CM

## 2012-06-02 MED ORDER — FLUOXETINE HCL 20 MG PO CAPS: 20.0000 mg | ORAL_CAPSULE | Freq: Every day | ORAL | Status: DC

## 2012-06-02 NOTE — Progress Notes (Signed)
[facility] [id number] Progress Note  Colleen Weber [id number] 27 y.o.  06/02/2012 9:59 AM  Chief Complaint: I still feel anxious.  I'm not sure if the medicine is working.  History of Present Illness: Patient is 27 year old Caucasian married unemployed female who came for her followup appointment.  She was seen first time on May 30 for initial evaluation.  She was experiencing anxiety symptoms.  She was started on Prozac 10 mg.  Patient complaining of cold sweats and palpitation that requires emergency visit 2 weeks ago.  However she was recommended to see psychiatrist.  She was also found UTI and she recently finished antibiotic.  Patient felt initially that it could be due to Prozac however she did not stop the Prozac.  No patient is doing better .  She do not have any close sweats or palpitation but she still feels anxious and nervous.  She denies any major panic attack.  She denies any recent crying spells but she continued to endorse emotional .  She sleeping better.  She admitted that she is not waking up in the middle of the night to check her husband's breathing.  She wants to continue Prozac .  Patient is not planning to get pregnant at this time.  Her husband requires left ankle surgery which may requires six-month rest.  Patient is not drinking or using any illegal substance.  She denies any aggression violence or any active or passive suicidal thoughts.  She is scheduled to see Belenda Cruise in 2 weeks.  Patient denies any tremors shakes .  Her appetite and weight is unchanged from the past.    Suicidal Ideation: No Plan Formed: No Patient has means to carry out plan: No  Homicidal Ideation: No Plan Formed: No Patient has means to carry out plan: No  Review of Systems  Constitutional: Negative for weight loss.  HENT: Negative.   Eyes: Negative.   Respiratory: Negative.   Cardiovascular: Positive for palpitations.  Musculoskeletal: Negative.   Skin: Negative.    Neurological: Negative.   Psychiatric/Behavioral: Negative for depression, suicidal ideas and memory loss. The patient is nervous/anxious and has insomnia.     Psychiatric: Agitation: No Hallucination: No Depressed Mood: No Insomnia: No Hypersomnia: No Altered Concentration: No Feels Worthless: No Grandiose Ideas: No Belief In Special Powers: No New/Increased Substance Abuse: No Compulsions: No  Neurologic: Headache: No Seizure: No Paresthesias: No  Medical History:  Patient has obesity and polycystic ovary disease.  She's taking metformin and multivitamin.  Her OB/GYN is Dr. Arelia Sneddon and a private physician is Dr. Shari Heritage.  She has no history of traumatic brain injury, seizures or any loss of consciousness.  History of abuse.  She denies any history of physical sexual verbal or emotional abuse.   Family history.  Patient endorse father has anxiety and a younger brother has anxiety and depression.   Psychosocial history.  Patient is born and raised in [place]. Her mother lives in [place] and her father lives in [place]. Her parents never married. Patient married twice. Her first marriage ended when her husband cheated her. She's living with her husband, aunt and grandparents. She has no children. Patient is very close to her mother and father and grandparents.  Education and work history. Patient has high school education. She's currently not working.  Alcohol and substance use history.  Patient denies any history of any illegal substance use. She drinks alcohol on rare occasions. She has no history of DWI, tremors shakes withdrawal  symptoms or any blackouts. She does not smoke. In the past she has smoked marijuana and heavy drinking when she was going through divorce however she stopped after 4 weeks of divorce.  Outpatient Encounter Prescriptions as of 06/02/2012  Medication Sig Dispense Refill  . aspirin 81 MG chewable tablet Chew 81 mg by mouth daily.       . Calcium Carb-Cholecalciferol (CALCIUM + D3 PO) Take 2 tablets by mouth daily. Vitamin shoppe brand chews      . ibuprofen (ADVIL,MOTRIN) 600 MG tablet Take 1 tablet (600 mg total) by mouth every 6 (six) hours as needed for pain.  40 tablet  0  . LORazepam (ATIVAN) 0.5 MG tablet Take 0.5 mg by mouth daily as needed for anxiety.      Marland Kitchen MAGNESIUM-ZINC PO Take 3 tablets by mouth daily. RDA recommended dosing      . metFORMIN (GLUCOPHAGE) 500 MG tablet Take 500 mg by mouth daily with breakfast.       . Multiple Vitamin (MULTIVITAMIN WITH MINERALS) TABS Take 1 tablet by mouth daily.      . [DISCONTINUED] FLUoxetine (PROZAC) 10 MG capsule Take 1 capsule (10 mg total) by mouth daily.  30 capsule  0  . diphenhydrAMINE (BENADRYL) 25 mg capsule Take 50 mg by mouth every 6 (six) hours as needed for itching.      Marland Kitchen FLUoxetine (PROZAC) 20 MG capsule Take 1 capsule (20 mg total) by mouth daily.  30 capsule  0   No facility-administered encounter medications on file as of 06/02/2012.    Past Psychiatric History/Hospitalization(s): Patient denies any history of psychiatric inpatient treatment or any suicidal attempt.  She has no history of paranoia, psychosis, mania posttraumatic stress symptoms.  She started have anxiety symptoms and her cousin died due to status epilepticus and patient found her why she was vacationing at her home. Anxiety: Yes Bipolar Disorder: No Depression: Yes Mania: No Psychosis: No Schizophrenia: No Personality Disorder: No Hospitalization for psychiatric illness: No History of Electroconvulsive Shock Therapy: No Prior Suicide Attempts: No  Physical Exam: Constitutional:  BP 130/90  Pulse 75  Ht 5\' 6"  (1.676 m)  Wt 232 lb (105.235 kg)  BMI 37.46 kg/m2  LMP 05/01/2012  General Appearance: well nourished and obese  Musculoskeletal: Strength & Muscle Tone: within normal limits Gait & Station: normal Patient leans: N/A  Psychiatric: Speech (describe rate, volume,  coherence, spontaneity, and abnormalities if any): Clear and coherent.  Normal volume tone and rhythm.  Thought Process (describe rate, content, abstract reasoning, and computation): Logical and goal-directed.  Associations: Relevant and Intact  Thoughts: normal  Mental Status: Orientation: oriented to person, place, time/date and situation Mood & Affect: anxiety and Emotional Attention Span & Concentration: Fair  Medical Decision Making (Choose Three): Established Problem, Stable/Improving (1), Review of Psycho-Social Stressors (1), Review or order clinical lab tests (1), Review of Last Therapy Session (1), Review of Medication Regimen & Side Effects (2) and Review of New Medication or Change in Dosage (2)  Assessment: Axis I: Anxiety disorder NOS  Axis II: Deferred  Axis III:  Patient Active Problem List   Diagnosis Date Noted  . PCO (polycystic ovaries) 05/12/2012  . Anxiety      Axis IV: Moderate  Axis V: 60-65   Plan:  I review her symptoms, recent emergency visit discharge summary, blood work and response to Prozac.  I do believe patient requires a higher dose of Prozac.  She's tolerating the medication better now.  I discussed  in length the risk and benefits of medication.  She is scheduled to see therapist in 3 weeks.  I recommend to call us back if she is a question of conservatively worsening of the symptom.  At this time patient is not planning to get pregnant however I encourage her if she ever decided to get pregnant that she should contact her OB/GYN and this Clinical research associate.  Time spent 30 minutes.  More than 50% of the time spent in psychoeducation counseling and coordination of care.  Lajune Perine T., MD 06/02/2012

## 2012-06-15 ENCOUNTER — Ambulatory Visit (HOSPITAL_COMMUNITY): Payer: Self-pay | Admitting: Licensed Clinical Social Worker

## 2012-06-21 ENCOUNTER — Encounter (HOSPITAL_COMMUNITY): Payer: Self-pay | Admitting: Licensed Clinical Social Worker

## 2012-06-21 ENCOUNTER — Ambulatory Visit (INDEPENDENT_AMBULATORY_CARE_PROVIDER_SITE_OTHER): Payer: BC Managed Care – PPO | Admitting: Licensed Clinical Social Worker

## 2012-06-21 DIAGNOSIS — F411 Generalized anxiety disorder: Secondary | ICD-10-CM

## 2012-06-21 NOTE — Progress Notes (Signed)
Patient ID: Colleen Weber, female   DOB: 01/29/1985, 27 y.o.   MRN: [id number] Patient:   Colleen Weber   DOB:   Feb 26, 1985  MR Number:  [id number]  Location:  [facility] [place] [id number] [place] [address] Dept: [phone]           Date of Service:   06/21/2012    Start Time:   9:30am End Time:   10:20am  Provider/Observer:  Geanie Berlin LCSW       Billing Code/Service: [phone]  Chief Complaint:     Chief Complaint  Patient presents with  . Anxiety    sleep poor, appetite wnl  . Panic Attack  . Trauma  . Agitation    tearfulness    Reason for Service:  Patient is referred by Dr. Lolly Mustache for the treatment of anxiety.   Current Status:  Patient presents with anxious mood and affect. She reports high levels of anxiety with panic attacks since February after her brother in law committed suicide. A year prior to that event, she found her cousin dead. She reports that the suicide began racing and irrational thoughts, generalized anxiety and specific fear that her husband would be killed on his job. She denies any nightmares, but does have difficulty falling alseep. Her appetite is wnl. She denies depression, mania, ocd, AH, VH or paranoia. She is happily married and has been trying to get pregnant over the past year without success. She endorses frustration over this and fear that she will have to take Clomid again, which made her angry and depressed. She has no psychiatric history and has stopped taking Prozac because it increased her anxiety. She denies any suicidal or homicidal ideation, intent or plan. She has a good support network.   Reliability of Information: good  Behavioral Observation: Colleen Weber  presents as a 27 y.o.-year-old  Caucasian Female who appeared her stated age. her dress was Appropriate and she was Fairly Groomed and her manners were Appropriate to the  situation.  There were not any physical disabilities noted.  she displayed an appropriate level of cooperation and motivation.    Interactions:    Active   Attention:   normal  Memory:   normal  Visuo-spatial:   normal  Speech (Volume):  normal  Speech:   normal pitch and normal volume  Thought Process:  Coherent and Relevant  Though Content:  WNL  Orientation:   person, place and time/date  Judgment:   Good  Planning:   Good  Affect:    Anxious  Mood:    Anxious  Insight:   Good  Intelligence:   normal  Marital Status/Living: Married three years. Lives with grandparents and aunt. No children. Trying to get pregnant.   Current Employment: Not working.   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   Substance Use:  No concerns of substance abuse are reported.    Education:   11th   Medical History:   Past Medical History  Diagnosis Date  . Polycystic ovarian syndrome   . Anxiety         Outpatient Encounter Prescriptions as of 06/21/2012  Medication Sig Dispense Refill  . aspirin 81 MG chewable tablet Chew 81 mg by mouth daily.      . Calcium Carb-Cholecalciferol (CALCIUM + D3 PO) Take 2 tablets by mouth daily. Vitamin shoppe brand chews      . LORazepam (ATIVAN) 0.5  MG tablet Take 0.5 mg by mouth daily as needed for anxiety.      . Multiple Vitamin (MULTIVITAMIN WITH MINERALS) TABS Take 1 tablet by mouth daily.      . diphenhydrAMINE (BENADRYL) 25 mg capsule Take 50 mg by mouth every 6 (six) hours as needed for itching.      Marland Kitchen FLUoxetine (PROZAC) 20 MG capsule Take 1 capsule (20 mg total) by mouth daily.  30 capsule  0  . ibuprofen (ADVIL,MOTRIN) 600 MG tablet Take 1 tablet (600 mg total) by mouth every 6 (six) hours as needed for pain.  40 tablet  0  . MAGNESIUM-ZINC PO Take 3 tablets by mouth daily. RDA recommended dosing      . metFORMIN (GLUCOPHAGE) 500 MG tablet Take 500 mg by mouth daily with breakfast.        No  facility-administered encounter medications on file as of 06/21/2012.          Sexual History:   History  Sexual Activity  . Sexually Active: Yes  . Birth Control/ Protection: None    Abuse/Trauma History: Found cousin deceased and brother in law committed suicide.   Psychiatric History:  None.   Family Med/Psych History:  Family History  Problem Relation Age of Onset  . Anxiety disorder Mother   . Anxiety disorder Brother     Risk of Suicide/Violence: virtually non-existent   Impression/DX:  Generalized anxiety disorder  Disposition/Plan:  Bi weekly treatment to address generalized anxiety and process unresolved grief.   Diagnosis:    Axis I: Generalized anxiety disorder       Axis II: Deferred       Axis III:  none      Axis IV:  problems related to social environment          Axis V:  61-70 mild symptoms

## 2012-06-22 ENCOUNTER — Ambulatory Visit (HOSPITAL_COMMUNITY): Payer: Self-pay | Admitting: Licensed Clinical Social Worker

## 2012-06-29 ENCOUNTER — Ambulatory Visit (HOSPITAL_COMMUNITY): Payer: Self-pay | Admitting: Psychiatry

## 2012-07-06 ENCOUNTER — Encounter (HOSPITAL_COMMUNITY): Payer: Self-pay | Admitting: Licensed Clinical Social Worker

## 2012-07-06 ENCOUNTER — Ambulatory Visit (HOSPITAL_COMMUNITY): Payer: Self-pay | Admitting: Licensed Clinical Social Worker

## 2012-07-06 NOTE — Progress Notes (Signed)
Patient ID: Colleen Weber, female   DOB: 1985/06/15, 27 y.o.   MRN: [id number] Patient cancelled late for today's appointment.

## 2012-07-18 ENCOUNTER — Emergency Department (HOSPITAL_COMMUNITY)
Admission: EM | Admit: 2012-07-18 | Discharge: 2012-07-18 | Disposition: A | Discharge status: Home / Self Care | Payer: BC Managed Care – PPO | Attending: Emergency Medicine | Admitting: Emergency Medicine

## 2012-07-18 ENCOUNTER — Encounter (HOSPITAL_COMMUNITY): Payer: Self-pay | Admitting: Emergency Medicine

## 2012-07-18 DIAGNOSIS — Z79899 Other long term (current) drug therapy: Secondary | ICD-10-CM | POA: Insufficient documentation

## 2012-07-18 DIAGNOSIS — Z885 Allergy status to narcotic agent status: Secondary | ICD-10-CM | POA: Insufficient documentation

## 2012-07-18 DIAGNOSIS — Z7982 Long term (current) use of aspirin: Secondary | ICD-10-CM | POA: Insufficient documentation

## 2012-07-18 DIAGNOSIS — M79609 Pain in unspecified limb: Secondary | ICD-10-CM | POA: Insufficient documentation

## 2012-07-18 DIAGNOSIS — R059 Cough, unspecified: Secondary | ICD-10-CM | POA: Insufficient documentation

## 2012-07-18 DIAGNOSIS — M545 Low back pain, unspecified: Secondary | ICD-10-CM | POA: Insufficient documentation

## 2012-07-18 DIAGNOSIS — R05 Cough: Secondary | ICD-10-CM | POA: Insufficient documentation

## 2012-07-18 DIAGNOSIS — R209 Unspecified disturbances of skin sensation: Secondary | ICD-10-CM | POA: Insufficient documentation

## 2012-07-18 DIAGNOSIS — R3915 Urgency of urination: Secondary | ICD-10-CM | POA: Insufficient documentation

## 2012-07-18 DIAGNOSIS — Z3202 Encounter for pregnancy test, result negative: Secondary | ICD-10-CM | POA: Insufficient documentation

## 2012-07-18 DIAGNOSIS — R252 Cramp and spasm: Secondary | ICD-10-CM | POA: Insufficient documentation

## 2012-07-18 DIAGNOSIS — F411 Generalized anxiety disorder: Secondary | ICD-10-CM | POA: Insufficient documentation

## 2012-07-18 DIAGNOSIS — E282 Polycystic ovarian syndrome: Secondary | ICD-10-CM | POA: Insufficient documentation

## 2012-07-18 DIAGNOSIS — Z881 Allergy status to other antibiotic agents status: Secondary | ICD-10-CM | POA: Insufficient documentation

## 2012-07-18 LAB — URINE MICROSCOPIC-ADD ON

## 2012-07-18 LAB — POCT I-STAT, CHEM 8
BUN: 7 mg/dL (ref 6–23)
Chloride: 102 mEq/L (ref 96–112)
Creatinine, Ser: 0.7 mg/dL (ref 0.50–1.10)
Sodium: 141 mEq/L (ref 135–145)
TCO2: 26 mmol/L (ref 0–100)

## 2012-07-18 LAB — URINALYSIS, ROUTINE W REFLEX MICROSCOPIC
Bilirubin Urine: NEGATIVE
Glucose, UA: NEGATIVE mg/dL
Hgb urine dipstick: NEGATIVE
Protein, ur: NEGATIVE mg/dL
Specific Gravity, Urine: 1.019 (ref 1.005–1.030)
Urobilinogen, UA: 0.2 mg/dL (ref 0.0–1.0)

## 2012-07-18 MED ORDER — IBUPROFEN 800 MG PO TABS
800.0000 mg | ORAL_TABLET | Freq: Once | ORAL | Status: AC
  Administered 2012-07-18: 800 mg via ORAL
  Filled 2012-07-18: qty 1

## 2012-07-18 MED ORDER — HYDROCODONE-ACETAMINOPHEN 5-325 MG PO TABS: 1.0000 | ORAL_TABLET | ORAL | Status: DC | PRN

## 2012-07-18 MED ORDER — CYCLOBENZAPRINE HCL 10 MG PO TABS: 10.0000 mg | ORAL_TABLET | Freq: Three times a day (TID) | ORAL | Status: DC | PRN

## 2012-07-18 NOTE — ED Provider Notes (Signed)
History    CSN: [id number] Arrival date & time 07/18/12  0120  First MD Initiated Contact with Patient 07/18/12 [phone]     Chief Complaint  Patient presents with  . Leg Pain   HPI  History provided by the patient. Patient is a 27 year old female with history of a polycystic ovary disease and anxiety who presents with complaints of pain and numbness to her right lower lateral leg and foot. Symptoms have been present for the past 2 days. Initially pain was intermittent cramping pain around the calf has become more constant soreness. This is associated with some numbness to the lateral aspect of the calf and leg into the fourth and fifth toes. There has been no swelling or redness of the skin. She denies similar symptoms previously. The patient does mention that she is currently finishing treatment for a urinary tract infection. She continues to feel some urinary urgency despite taking antibiotics for the past 9 days. She also has occasional pressure low back. She denies any recent injuries or trauma. Denies any fever, chills or sweats. Denies any other aggravating or alleviating factors. Denies any other associated symptoms.    Past Medical History  Diagnosis Date  . Polycystic ovarian syndrome   . Anxiety    History reviewed. No pertinent past surgical history. Family History  Problem Relation Age of Onset  . Anxiety disorder Mother   . Anxiety disorder Brother    History  Substance Use Topics  . Smoking status: Never Smoker   . Smokeless tobacco: Not on file  . Alcohol Use: Yes     Comment: rarely   OB History   Grav Para Term Preterm Abortions TAB SAB Ect Mult Living                 Review of Systems  Respiratory: Positive for cough. Negative for shortness of breath.   Cardiovascular: Negative for chest pain.  Genitourinary: Positive for urgency. Negative for dysuria, frequency and flank pain.  Musculoskeletal:       Right lower leg pain  Neurological: Positive for  numbness. Negative for weakness.  All other systems reviewed and are negative.    Allergies  Amoxicillin and Codeine  Home Medications   Current Outpatient Rx  Name  Route  Sig  Dispense  Refill  . aspirin 81 MG chewable tablet   Oral   Chew 81 mg by mouth daily as needed for pain.          Marland Kitchen FLUoxetine (PROZAC) 20 MG capsule   Oral   Take 1 capsule (20 mg total) by mouth daily.   30 capsule   0   . Multiple Vitamin (MULTIVITAMIN WITH MINERALS) TABS   Oral   Take 1 tablet by mouth daily.          BP 152/103  Temp(Src) 97.9 F (36.6 C) (Oral)  Resp 18  SpO2 99%  LMP 07/01/2012 Physical Exam  Nursing note and vitals reviewed. Constitutional: She is oriented to person, place, and time. She appears well-developed and well-nourished. No distress.  HENT:  Head: Normocephalic.  Cardiovascular: Normal rate and regular rhythm.   Pulmonary/Chest: Effort normal and breath sounds normal. No respiratory distress. She has no wheezes. She has no rales.  Abdominal: Soft. There is no tenderness.  Patient is obese  Musculoskeletal: Normal range of motion. She exhibits no edema and no tenderness.       Cervical back: Normal.       Thoracic back: Normal.  Lumbar back: She exhibits tenderness.       Back:  No significant swelling of the right lower extremity. Skin is normal without erythema. There is slight tenderness around the right lateral calf and mid leg. No severe pain over the fibula and no deformities. Normal movement of the ankle and toes with normal strength. Normal dorsal pedal pulses and cap refill to the toes.  There is some mild lower lumbar and sacral area tenderness. No deformities  Neurological: She is alert and oriented to person, place, and time.  Reflex Scores:      Patellar reflexes are 2+ on the right side and 2+ on the left side.      Achilles reflexes are 2+ on the right side and 2+ on the left side. Patient reports slight change in sensation over  her right fourth and fifth toes  Skin: Skin is warm and dry. No rash noted.  Psychiatric: She has a normal mood and affect. Her behavior is normal.    ED Course  Procedures   Results for orders placed during the hospital encounter of 07/18/12  URINALYSIS, ROUTINE W REFLEX MICROSCOPIC      Result Value Range   Color, Urine YELLOW  YELLOW   APPearance CLOUDY (*) CLEAR   Specific Gravity, Urine 1.019  1.005 - 1.030   pH 7.0  5.0 - 8.0   Glucose, UA NEGATIVE  NEGATIVE mg/dL   Hgb urine dipstick NEGATIVE  NEGATIVE   Bilirubin Urine NEGATIVE  NEGATIVE   Ketones, ur NEGATIVE  NEGATIVE mg/dL   Protein, ur NEGATIVE  NEGATIVE mg/dL   Urobilinogen, UA 0.2  0.0 - 1.0 mg/dL   Nitrite NEGATIVE  NEGATIVE   Leukocytes, UA SMALL (*) NEGATIVE  URINE MICROSCOPIC-ADD ON      Result Value Range   Squamous Epithelial / LPF MANY (*) RARE   WBC, UA 0-2  <3 WBC/hpf   RBC / HPF 0-2  <3 RBC/hpf   Bacteria, UA FEW (*) RARE  POCT PREGNANCY, URINE      Result Value Range   Preg Test, Ur NEGATIVE  NEGATIVE  POCT I-STAT, CHEM 8      Result Value Range   Sodium 141  135 - 145 mEq/L   Potassium 4.1  3.5 - 5.1 mEq/L   Chloride 102  96 - 112 mEq/L   BUN 7  6 - 23 mg/dL   Creatinine, Ser 4.13  0.50 - 1.10 mg/dL   Glucose, Bld 92  70 - 99 mg/dL   Calcium, Ion 2.44  0.10 - 1.23 mmol/L   TCO2 26  0 - 100 mmol/L   Hemoglobin 15.6 (*) 12.0 - 15.0 g/dL   HCT 27.2  53.6 - 64.4 %      1. Lower leg pain, right       MDM  Patient seen and evaluated. Patient appears well no acute distress. She does not appear severely or toxic. There is no significant swelling of the lower extremity. The skin is normal without erythema.  Angus Seller, PA-C 07/18/12 [phone]

## 2012-07-18 NOTE — ED Notes (Signed)
PT. REPORTS PAIN AT LEFT CALF MUSCLE FOR 2 DAYS , DENIES INJURY , AMBULATORY , PT. ALSO REPORTS OCCASIONAL DRY COUGH AND SLIGHT SOB . PT. ALSO STATED TAKING ANTIBIOTIC FOR UTI.

## 2012-07-19 NOTE — ED Provider Notes (Signed)
Medical screening examination/treatment/procedure(s) were performed by non-physician practitioner and as supervising physician I was immediately available for consultation/collaboration.   Julie Manly, MD 07/19/12 0901 

## 2012-07-20 ENCOUNTER — Ambulatory Visit (INDEPENDENT_AMBULATORY_CARE_PROVIDER_SITE_OTHER): Payer: BC Managed Care – PPO | Admitting: Licensed Clinical Social Worker

## 2012-07-20 DIAGNOSIS — F419 Anxiety disorder, unspecified: Secondary | ICD-10-CM

## 2012-07-20 DIAGNOSIS — F411 Generalized anxiety disorder: Secondary | ICD-10-CM

## 2012-07-20 NOTE — Progress Notes (Signed)
   THERAPIST PROGRESS NOTE  Session Time: 9:30am-10:20am  Participation Level: Active  Behavioral Response: Well GroomedAlertAnxious and Euthymic  Type of Therapy: Individual Therapy  Treatment Goals addressed: Coping  Interventions: CBT, DBT, Strength-based, Supportive and Reframing  Summary: [name] is a 27 y.o. female who presents with euthymic mood and bright affect. She reports improvement in her anxiety over the past month. She believes that some of this is related to the fact that her husband is home recovering from surgery and she is therefore not anxious about him becoming injured while on the job. She processes her grief related to her cousins suicide and feels she is making progress because she is sleeping in the room where she was found. She thought this would be too difficult, but has found this to be helpful. She wakes up with anxiety in the middle of the night. She is uncertain why this happens. On occasion, she will take Ativan to help her. She is not taking Prozac as it increased her anxiety. Her sleep and appetite are wnl.    Suicidal/Homicidal: Nowithout intent/plan  Therapist Response: Assessed patients current functioning and reviewed progress. Reviewed coping strategies. Assessed patients safety and assisted in identifying protective factors.  Reviewed crisis plan with patient. Assisted patient with the expression of anxiety. Reviewed patients self care plan. Assessed progress related to self care. Patients self care is good. Recommend daily exercise, increased socialization and recreation. Used CBT to assist patient with the identification of negative distortions and irrational thoughts. Encouraged patient to verbalize alternative and factual responses which challenge thought distortions. Used motivational interviewing to assist and encourage patient through the change process. Explored patients barriers to change. Used DBT to practice mindfulness, review  distraction list and improve distress tolerance skills. Processed and normalized patients grief reaction.   Plan: Return again in when needed.  Diagnosis: Axis I: Generalized Anxiety Disorder    Axis II: No diagnosis    Lucinda Spells, LCSW 07/20/2012

## 2012-07-30 ENCOUNTER — Encounter (HOSPITAL_COMMUNITY): Payer: Self-pay | Admitting: Emergency Medicine

## 2012-07-30 ENCOUNTER — Emergency Department (HOSPITAL_COMMUNITY)
Admission: EM | Admit: 2012-07-30 | Discharge: 2012-07-30 | Disposition: A | Discharge status: Home / Self Care | Payer: BC Managed Care – PPO | Attending: Emergency Medicine | Admitting: Emergency Medicine

## 2012-07-30 DIAGNOSIS — Z880 Allergy status to penicillin: Secondary | ICD-10-CM | POA: Insufficient documentation

## 2012-07-30 DIAGNOSIS — M545 Low back pain, unspecified: Secondary | ICD-10-CM | POA: Insufficient documentation

## 2012-07-30 DIAGNOSIS — Z8639 Personal history of other endocrine, nutritional and metabolic disease: Secondary | ICD-10-CM | POA: Insufficient documentation

## 2012-07-30 DIAGNOSIS — Z7982 Long term (current) use of aspirin: Secondary | ICD-10-CM | POA: Insufficient documentation

## 2012-07-30 DIAGNOSIS — R209 Unspecified disturbances of skin sensation: Secondary | ICD-10-CM | POA: Insufficient documentation

## 2012-07-30 DIAGNOSIS — Z79899 Other long term (current) drug therapy: Secondary | ICD-10-CM | POA: Insufficient documentation

## 2012-07-30 DIAGNOSIS — R350 Frequency of micturition: Secondary | ICD-10-CM | POA: Insufficient documentation

## 2012-07-30 DIAGNOSIS — Z862 Personal history of diseases of the blood and blood-forming organs and certain disorders involving the immune mechanism: Secondary | ICD-10-CM | POA: Insufficient documentation

## 2012-07-30 DIAGNOSIS — F411 Generalized anxiety disorder: Secondary | ICD-10-CM | POA: Insufficient documentation

## 2012-07-30 DIAGNOSIS — Z792 Long term (current) use of antibiotics: Secondary | ICD-10-CM | POA: Insufficient documentation

## 2012-07-30 DIAGNOSIS — N390 Urinary tract infection, site not specified: Secondary | ICD-10-CM | POA: Insufficient documentation

## 2012-07-30 DIAGNOSIS — R202 Paresthesia of skin: Secondary | ICD-10-CM

## 2012-07-30 LAB — URINALYSIS, ROUTINE W REFLEX MICROSCOPIC
Glucose, UA: NEGATIVE mg/dL
Ketones, ur: NEGATIVE mg/dL
Leukocytes, UA: NEGATIVE
Nitrite: NEGATIVE
Protein, ur: NEGATIVE mg/dL
Urobilinogen, UA: 0.2 mg/dL (ref 0.0–1.0)

## 2012-07-30 NOTE — ED Notes (Signed)
PT. REPORTS RIGHT LOWER CALF NUMBNESS FOR 4 WEEKS , DENIES INJURY , AMBULATORY , SEEN HERE 2 WEEKS AGO DIAGNOSED WITH " PINCHED NERVE ".

## 2012-07-30 NOTE — ED Provider Notes (Signed)
CSN: [id number]     Arrival date & time 07/30/12  2038 History     First MD Initiated Contact with Patient 07/30/12 2052     Chief Complaint  Patient presents with  . Numbness   (Consider location/radiation/quality/duration/timing/severity/associated sxs/prior Treatment) HPI History provided by pt.   27yo F w/ h/o PCOS and anxiety presents to the ED for the second time w/ RLE paresthesias.  Numbness most prominent distal 2/3rds of posterolateral calf and fifth digit.  Associated w/ mild pain right low back, mild pain at lateral malleolus and ecchymosis of calf .  Seen for same on 7/15 and discharged home w/ pain medication.  Pt reports that there was some improvement with analgesics, but since then, she has developed more diffuse paresthesias of bilateral lower legs, as well as posterior aspect of forearms and 4th-5th fingers.  Intermittent and often notices only when she touches her skin.  It sends a tingly sensation up her extremities.  Denies headache, neck pain, vision changes, dizziness, dysarthria, dysphagia, extremity weakness.  No recent traumas.  Has been on an antibiotic for UTI recently, but otherwise no new medications.  No FH MS or other neurologic disorder.  Also reports persistent increased urinary frequency since treatment for UTI. Past Medical History  Diagnosis Date  . Polycystic ovarian syndrome   . Anxiety    History reviewed. No pertinent past surgical history. Family History  Problem Relation Age of Onset  . Anxiety disorder Mother   . Anxiety disorder Brother    History  Substance Use Topics  . Smoking status: Never Smoker   . Smokeless tobacco: Not on file  . Alcohol Use: Yes     Comment: rarely   OB History   Grav Para Term Preterm Abortions TAB SAB Ect Mult Living                 Review of Systems  All other systems reviewed and are negative.    Allergies  Amoxicillin and Codeine  Home Medications   Current Outpatient Rx  Name  Route  Sig   Dispense  Refill  . aspirin 81 MG chewable tablet   Oral   Chew 81 mg by mouth daily as needed for pain.          . cyclobenzaprine (FLEXERIL) 10 MG tablet   Oral   Take 1 tablet (10 mg total) by mouth 3 (three) times daily as needed for muscle spasms.   30 tablet   0   . FLUoxetine (PROZAC) 20 MG capsule   Oral   Take 1 capsule (20 mg total) by mouth daily.   30 capsule   0   . HYDROcodone-acetaminophen (NORCO) 5-325 MG per tablet   Oral   Take 1 tablet by mouth every 4 (four) hours as needed for pain.   20 tablet   0   . Multiple Vitamin (MULTIVITAMIN WITH MINERALS) TABS   Oral   Take 1 tablet by mouth daily.          BP 173/91  Pulse 115  Temp(Src) 98 F (36.7 C) (Oral)  Resp 16  SpO2 98%  LMP 07/01/2012 Physical Exam  Nursing note and vitals reviewed. Constitutional: She is oriented to person, place, and time. She appears well-developed and well-nourished. No distress.  HENT:  Head: Normocephalic and atraumatic.  Eyes:  Normal appearance  Neck: Normal range of motion.  Cardiovascular: Normal rate, regular rhythm and intact distal pulses.   Pulmonary/Chest: Effort normal and breath sounds  normal.  Musculoskeletal: Normal range of motion.  Mild tenderness lumbar spine.  LEs w/out edema or tenderness.  Full, active ROM.    Neurological: She is alert and oriented to person, place, and time. No sensory deficit. Coordination normal.  CN 3-12 intact.  No nystagmus. 5/5 and equal upper and lower extremity strength.  Light palpation diffuse lower legs causes a tingling sensation; most prominent right posterolateral calf and 5th toe.  No sensory deficits upper extremities.  No past pointing.     Skin: Skin is warm and dry. No rash noted.  No obvious ecchymosis of RLE  Psychiatric: She has a normal mood and affect. Her behavior is normal.    ED Course   Procedures (including critical care time)  Labs Reviewed  URINALYSIS, ROUTINE W REFLEX MICROSCOPIC   No  results found. 1. Paresthesias     MDM  26yo F w/ PCOS and anxiety presents w/ paresthesias posterolateral right calf and 5th toe x 4 weeks.  Evaluated for same in ED 2 wks ago and told she may have a radiculopathy; Chem 8 neg at that time.  Returns today because numbness has spread to all four extremities.  No other associated neurologic symptoms, but she does have mild right low back pain.  No recent head/back trauma or medication changes and no FH neurologic disease.  No objective exam findings w/ exception of mild lumbar spinal tenderness; patient has sensation in all four extremities but light touch induces tingling up her extremities.  Discussed case w/ Dr. Amada Jupiter who says that patient should f/u with neuro and may need an MRI of lumbar spine in future, but there is no indication for emergent imaging.  I am in agreement w/ this plan.  Discussed w/ patient.  Advised her to return if she develops significant weakness in one of her extremities.  U/A obtained today because of persistent urinary frequency despite recent treatment for UTI, and is negative.  10:39 PM   Otilio Miu, PA-C 07/30/12 2240

## 2012-07-31 NOTE — ED Provider Notes (Signed)
Medical screening examination/treatment/procedure(s) were performed by non-physician practitioner and as supervising physician I was immediately available for consultation/collaboration.  Derwood Kaplan, MD 07/31/12 [phone]

## 2012-08-15 ENCOUNTER — Encounter: Payer: Self-pay | Admitting: Neurology

## 2012-08-15 ENCOUNTER — Ambulatory Visit (INDEPENDENT_AMBULATORY_CARE_PROVIDER_SITE_OTHER): Payer: BC Managed Care – PPO | Admitting: Neurology

## 2012-08-15 VITALS — BP 118/70 | HR 98 | Temp 98.2°F | Ht 66.0 in | Wt 237.0 lb

## 2012-08-15 DIAGNOSIS — M549 Dorsalgia, unspecified: Secondary | ICD-10-CM

## 2012-08-15 DIAGNOSIS — R209 Unspecified disturbances of skin sensation: Secondary | ICD-10-CM

## 2012-08-15 DIAGNOSIS — R200 Anesthesia of skin: Secondary | ICD-10-CM

## 2012-08-15 LAB — VITAMIN B12: Vitamin B-12: 832 pg/mL (ref 211–911)

## 2012-08-15 NOTE — Patient Instructions (Signed)
I think the back pain is separate for the numbness.  I think the back pain is due to muscle tightness and the numbness was most likely related to anxiety. 1.  For back pain, continue motrin as needed and may take a flexeril if really bad.  Also ask about stretching exercises for the back.  Continue heat pads for comfort. 2. To be sure, I would still like to check a B12 level (regarding the numbness). 3. Follow up as needed.  If the numbness returns, then call and we can pursue other tests.

## 2012-08-15 NOTE — Progress Notes (Signed)
NEUROLOGY CONSULTATION NOTE  Colleen Weber MRN: [id number] DOB: January 03, 1986  Referring provider: Dr. Derwood Kaplan MD (ED) Primary care provider: Dr. Wynelle Link  Reason for consult:  Paresthesia, back pain  HISTORY OF PRESENT ILLNESS: Colleen Weber is a 27 year old female with history of anxiety and PCOS who presents with paresthesias.  She is accompanied by her significant other.  Records and images reviewed where available.  About a month ago, she stood up and developed numbness in both legs, most prominent on the right. She had numbness on the lateral aspect of both calves, as well as numbness radiating down to the fifth toe on the right. The numbness in the left leg resolved quickly but the right leg persisted. She felt that her right leg was heavy but she was able to walk okay. She denied any shooting pain down the legs. The numbness seems okay when ambulating, but she mostly noticed it when she was off her feet. She also has long-standing history of low back pain. 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 Also, her significant other had foot surgery, so she is been doing a lot of lifting and carrying. She presented to the ED on 07/18/12.  It was suggested that she may have a lumbar radiculopathy and she was sent home with pain medications.  She returned to the ED on 07/30/12, as the numbness also involved both lateral upper arms, as well as down to the 5th finger on the left, however, this resolved after a couple of days.  No neck pain, visual changes, dizziness or weakness was expressed.  At the onset of symptoms, she had been taking antibiotics for a UTI for about a week.  She has a history of anxiety, in which she will experience paresthesias with an attack.  In the past, UTI have precipitated panic attacks.  It was suggested that she follow-up with outpatient neurology for a workup.  Symptoms have resolved, but she still notes the  localized back pain across the lower back.  She thinks she feels a "bump" in that area, as well.  The pain is worse with prolonged activity and better with rest.  She feels fine when she gets out of bed in the morning.  She was given flexeril and hydrocodone for back pain.  She only uses the flexeril sparingly, and usually uses motrin.  She takes a B complex vitamin.  Labs (04/23/12): TSH 1.195 Labs (07/31/12): UA negative Labs (07/19/12): Chem 8 unremarkable, UA cloudy with small LE, few bacteria, but negative nitrite  PAST MEDICAL HISTORY: Past Medical History  Diagnosis Date  . Polycystic ovarian syndrome   . Anxiety     PAST SURGICAL HISTORY: No past surgical history on file.  MEDICATIONS: Current Outpatient Prescriptions on File Prior to Visit  Medication Sig Dispense Refill  . aspirin 81 MG chewable tablet Chew 81 mg by mouth daily as needed for pain.       Marland Kitchen ibuprofen (ADVIL,MOTRIN) 200 MG tablet Take 200 mg by mouth every 6 (six) hours as needed for pain.      Marland Kitchen MAGNESIUM PO Take 3 tablets by mouth daily.      . Multiple Vitamin (MULTIVITAMIN WITH MINERALS) TABS Take 1 tablet by mouth daily.       No current facility-administered medications on file prior to visit.    ALLERGIES: Allergies  Allergen Reactions  . Amoxicillin Anaphylaxis, Swelling and Other (  See Comments)    Throat swelling, Benadryl does help to reduce swelling  . Codeine Itching and Rash    Patient can take this, but she must take a Benadryl with it.    FAMILY HISTORY: Family History  Problem Relation Age of Onset  . Anxiety disorder Mother   . Anxiety disorder Brother     SOCIAL HISTORY: History   Social History  . Marital Status: Married    Spouse Name: N/A    Number of Children: N/A  . Years of Education: N/A   Occupational History  . Not on file.   Social History Main Topics  . Smoking status: Never Smoker   . Smokeless tobacco: Never Used  . Alcohol Use: Yes     Comment: rarely  .  Drug Use: No  . Sexually Active: Yes    Birth Control/ Protection: None   Other Topics Concern  . Not on file   Social History Narrative  . No narrative on file    REVIEW OF SYSTEMS: Constitutional: No fevers, chills, or sweats, no generalized fatigue, change in appetite Eyes: No visual changes, double vision, eye pain Ear, nose and throat: No hearing loss, ear pain, nasal congestion, sore throat Cardiovascular: No chest pain, palpitations Respiratory:  No shortness of breath at rest or with exertion, wheezes GastrointestinaI: No nausea, vomiting, diarrhea, abdominal pain, fecal incontinence Genitourinary:  No dysuria, urinary retention or frequency Musculoskeletal:  No neck pain, back pain Integumentary: No rash, pruritus, skin lesions Neurological: as above Psychiatric: No depression, insomnia, anxiety Endocrine: No palpitations, fatigue, diaphoresis, mood swings, change in appetite, change in weight, increased thirst Hematologic/Lymphatic:  No anemia, purpura, petechiae. Allergic/Immunologic: no itchy/runny eyes, nasal congestion, recent allergic reactions, rashes  PHYSICAL EXAM: Filed Vitals:   08/15/12 0743  BP: 118/70  Pulse: 98  Temp: 98.2 F (36.8 C)   General: No acute distress Head:  Normocephalic/atraumatic Neck: supple, no paraspinal tenderness, full range of motion Back: Tenderness to palpation across the small of her back.  No mass noted. Heart: regular rate and rhythm Lungs: Clear to auscultation bilaterally. Vascular: No carotid bruits. Neurological Exam: Mental status: alert and oriented to person, place, and time, speech fluent and not dysarthric, language intact. Cranial nerves: CN I: not tested CN II: pupils equal, round and reactive to light, visual fields intact, fundi unremarkable. CN III, IV, VI:  full range of motion, no nystagmus, no ptosis CN V: facial sensation intact CN VII: upper and lower face symmetric CN VIII: hearing intact CN IX,  X: gag intact, uvula midline CN XI: sternocleidomastoid and trapezius muscles intact CN XII: tongue midline Bulk & Tone: normal, no fasciculations. Motor: 5/5 throughout Sensation: pinprick and vibration intact Deep Tendon Reflexes: brisk throughout, toes down Finger to nose testing: normal Gait: normal stance and stride, able to walk on toes, heels and in tandem. Romberg negative.  IMPRESSION & PLAN: Paresthesia, resolved.  Most likely related to anxiety.  She presents with normal exam, which does not suggest central or peripheral neurological process. 1.  She takes a B complex vitamin, but will check B12 level to make sure. 2.  Back pain.  Localized, non-radiating.  Likely myofascial.  - Recommend continue motrin as needed.  Take Flexeril 10mg   if severe (up to 3x).  Caution driving if take flexeril  - She reports her mother is a PT.  Ask her for any home exercises for the back  - Hot packs No follow up necessary.  May call as needed.  Thank you for allowing me to take part in the care of this patient.  Shon Millet, DO  CC:  Deatra James, MD

## 2012-08-16 ENCOUNTER — Telehealth: Payer: Self-pay | Admitting: Neurology

## 2012-08-16 NOTE — Telephone Encounter (Signed)
Spoke with the patient. Informed B12 level was normal. No concerns/questions voiced at this time.

## 2012-08-16 NOTE — Telephone Encounter (Signed)
Message copied by Benay Spice on Wed Aug 16, 2012  9:35 AM ------      Message from: JAFFE, ADAM R      Created: Tue Aug 15, 2012  3:46 PM         B12 level is okay.      AJ      ----- Message -----         From: [facility] Interface         Sent: 08/15/2012   3:17 PM           To: Cira Servant, DO                   ------

## 2014-09-09 IMAGING — CR DG CHEST 2V
2 series · 2 of 2 positions shown · non-contrast
Comparison: 03/23/2012.

CLINICAL DATA: Chest pain.

CHEST - 2 VIEW

[w chest pa]
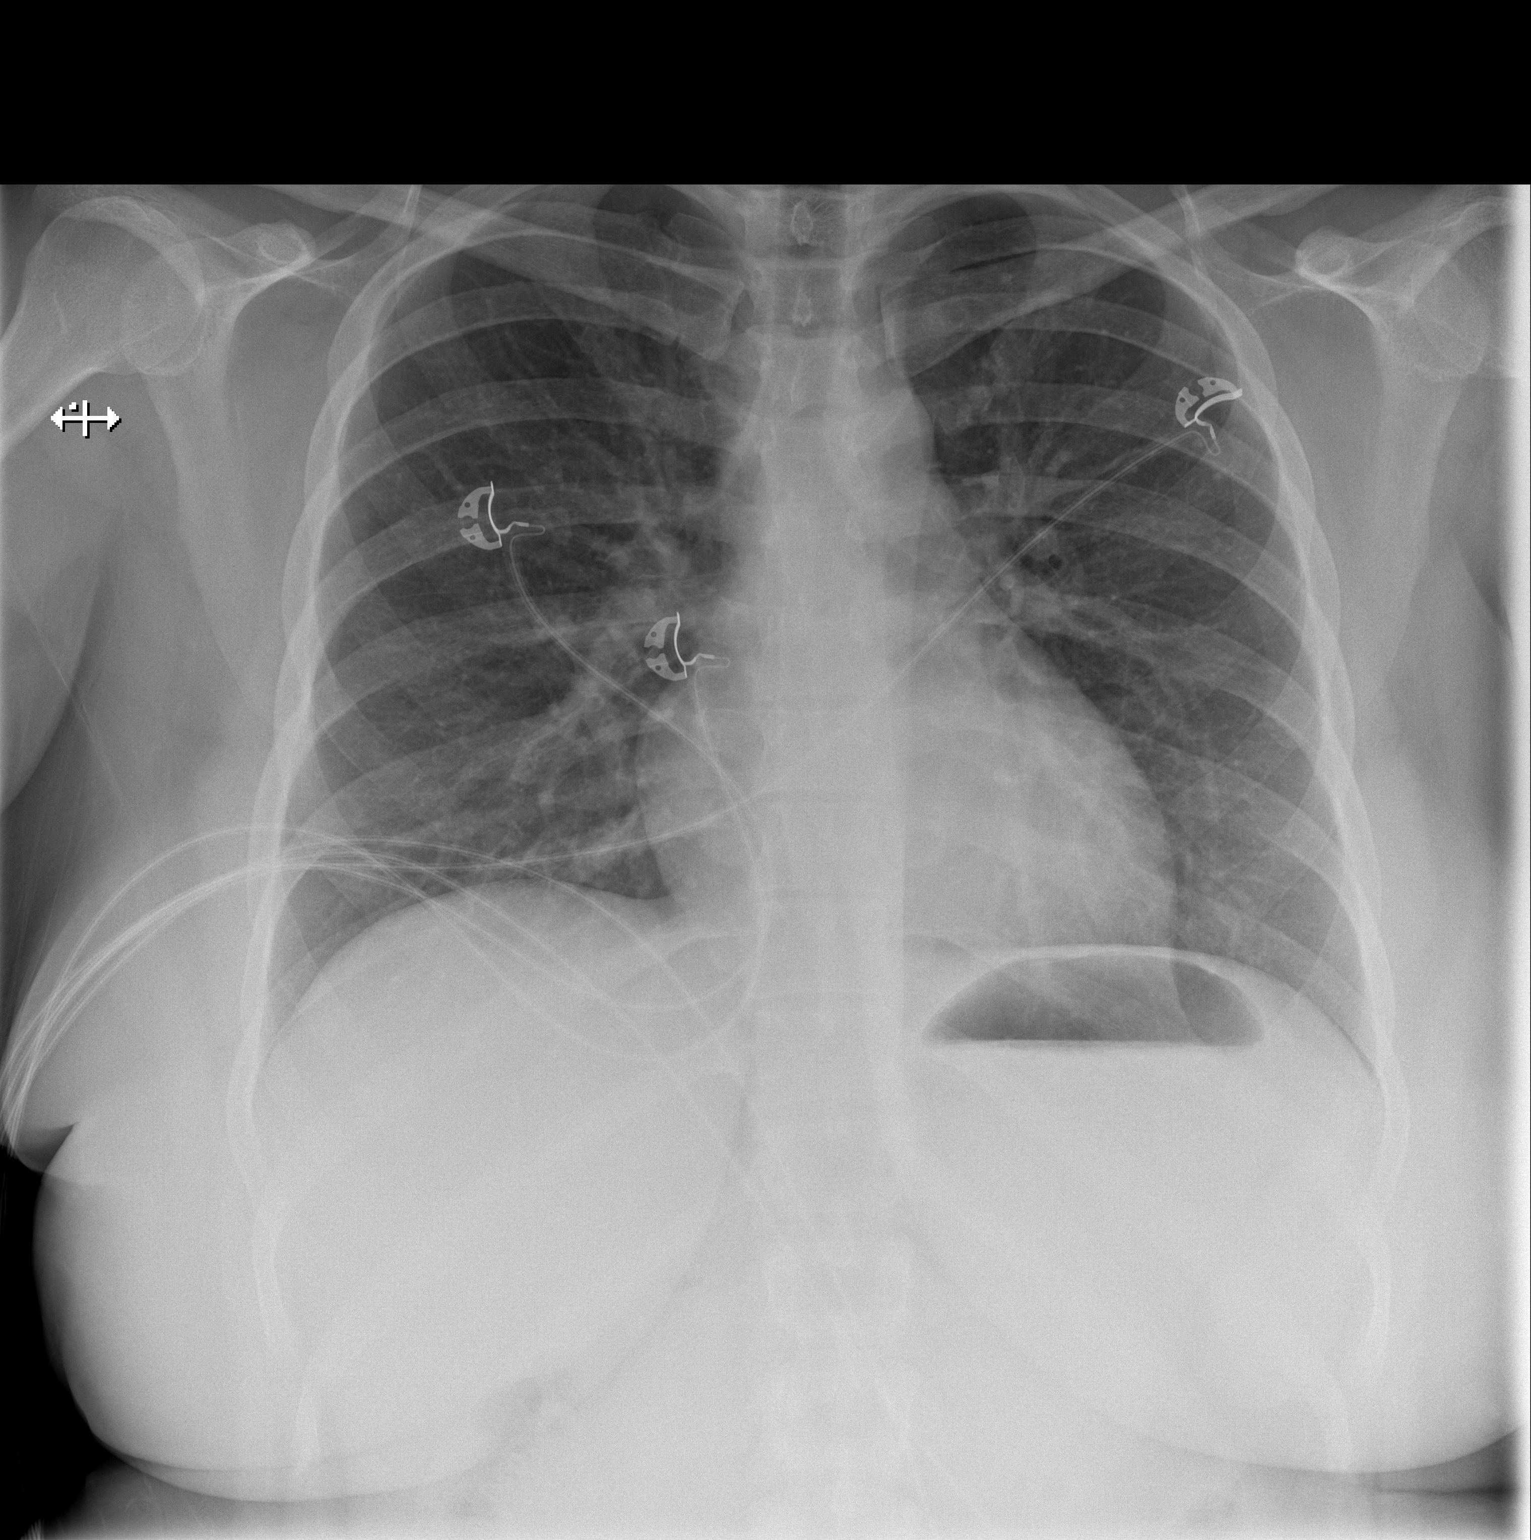

[w chest lat]
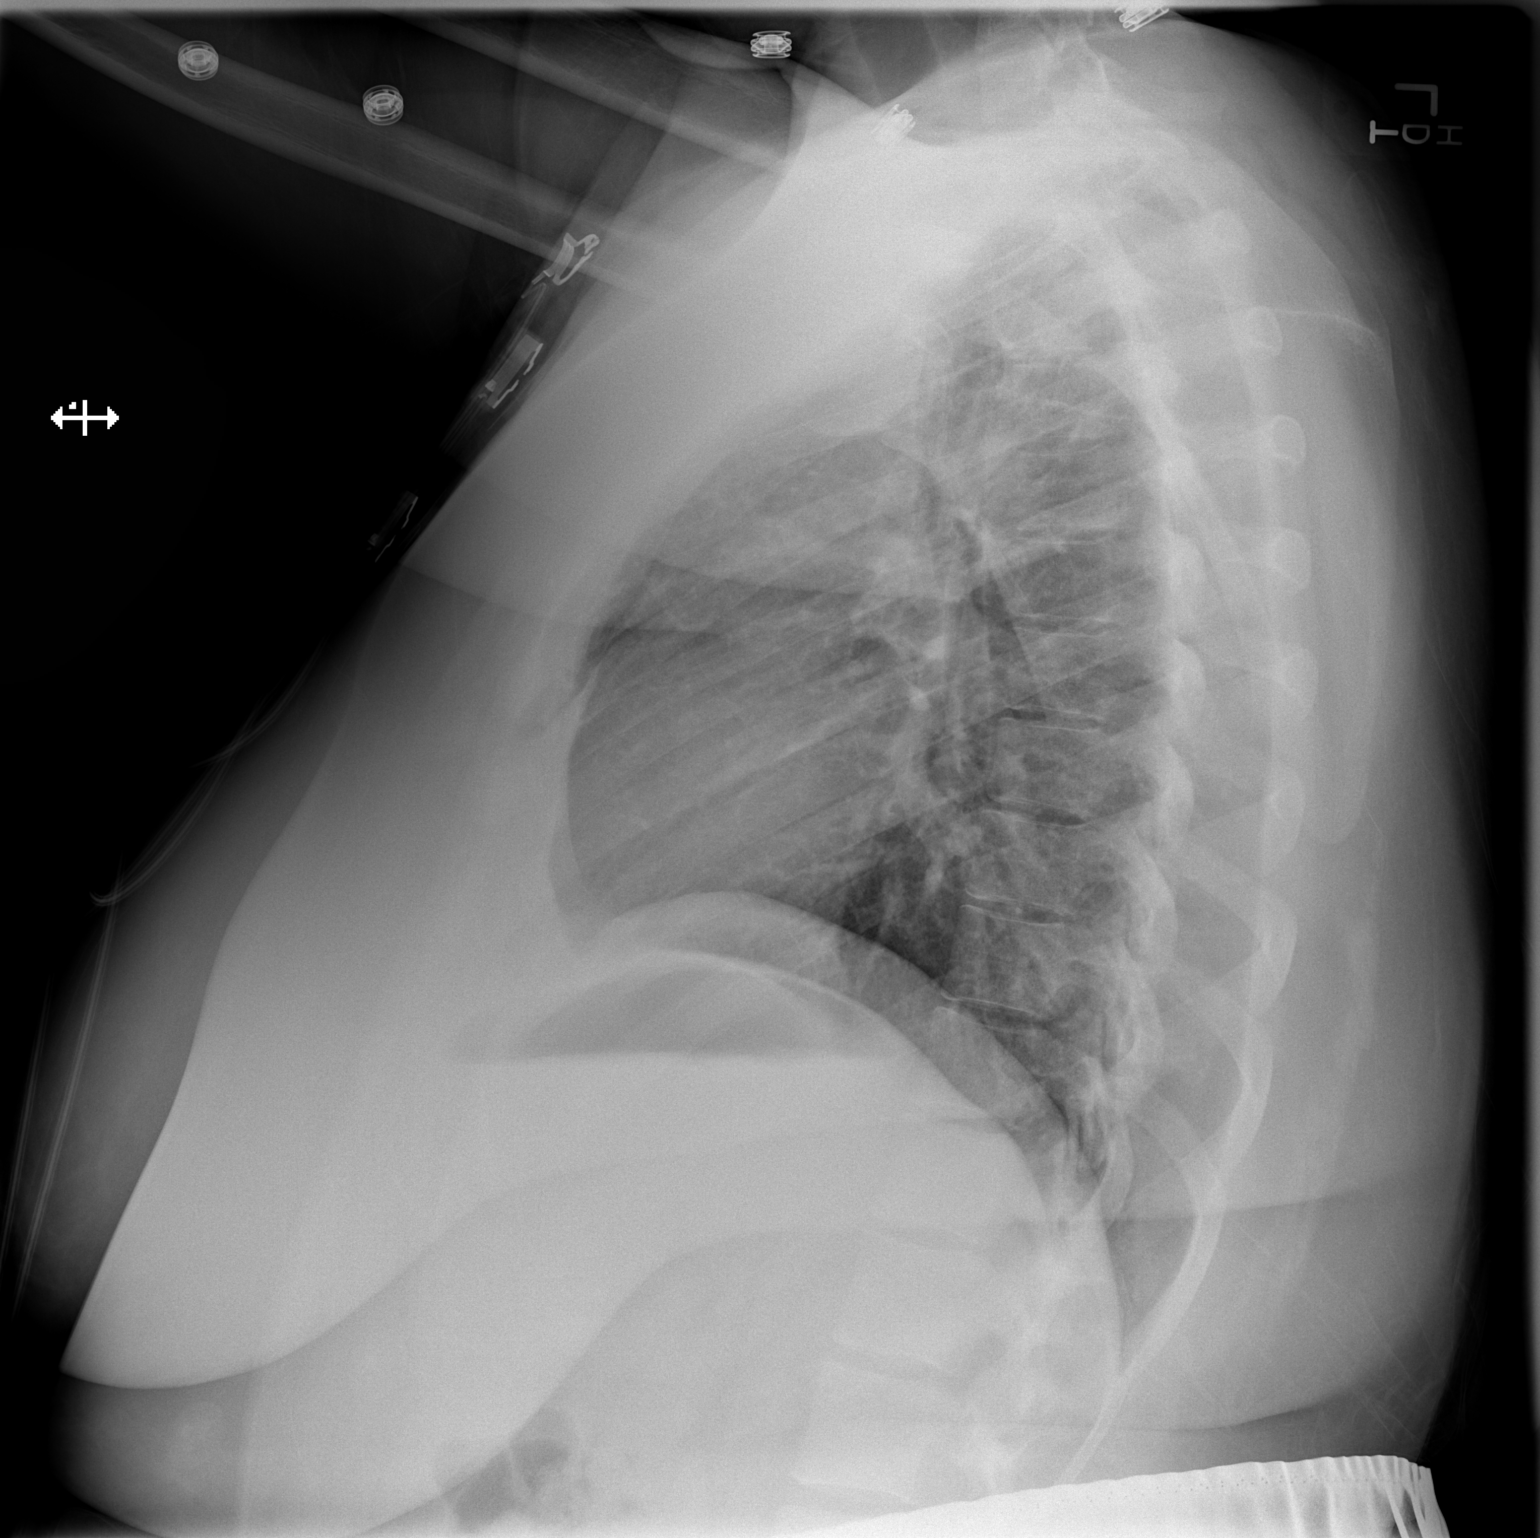

[2 of 2 positions shown; findings below may reference images not displayed]

FINDINGS: The cardiac silhouette, mediastinal and hilar contours
are normal and stable.  The lungs are clear.  No pleural effusion.
The bony thorax is intact.
IMPRESSION: No acute cardiopulmonary findings.

## 2015-04-12 ENCOUNTER — Inpatient Hospital Stay (HOSPITAL_COMMUNITY): Payer: Managed Care, Other (non HMO)

## 2015-04-12 ENCOUNTER — Inpatient Hospital Stay (HOSPITAL_COMMUNITY)
Admission: AD | Admit: 2015-04-12 | Discharge: 2015-04-13 | Disposition: A | Discharge status: Home / Self Care | Payer: Managed Care, Other (non HMO) | Source: Ambulatory Visit | Attending: Obstetrics and Gynecology | Admitting: Obstetrics and Gynecology

## 2015-04-12 ENCOUNTER — Encounter (HOSPITAL_COMMUNITY): Payer: Self-pay

## 2015-04-12 DIAGNOSIS — F419 Anxiety disorder, unspecified: Secondary | ICD-10-CM | POA: Insufficient documentation

## 2015-04-12 DIAGNOSIS — Z79899 Other long term (current) drug therapy: Secondary | ICD-10-CM | POA: Insufficient documentation

## 2015-04-12 DIAGNOSIS — Z7984 Long term (current) use of oral hypoglycemic drugs: Secondary | ICD-10-CM | POA: Diagnosis not present

## 2015-04-12 DIAGNOSIS — N946 Dysmenorrhea, unspecified: Secondary | ICD-10-CM | POA: Diagnosis not present

## 2015-04-12 DIAGNOSIS — R1031 Right lower quadrant pain: Secondary | ICD-10-CM | POA: Diagnosis not present

## 2015-04-12 DIAGNOSIS — E282 Polycystic ovarian syndrome: Secondary | ICD-10-CM | POA: Diagnosis not present

## 2015-04-12 DIAGNOSIS — R109 Unspecified abdominal pain: Secondary | ICD-10-CM | POA: Diagnosis present

## 2015-04-12 HISTORY — DX: Female infertility, unspecified: N97.9

## 2015-04-12 LAB — URINALYSIS, ROUTINE W REFLEX MICROSCOPIC
Bilirubin Urine: NEGATIVE
Glucose, UA: NEGATIVE mg/dL
Ketones, ur: NEGATIVE mg/dL
LEUKOCYTES UA: NEGATIVE
Nitrite: NEGATIVE
PROTEIN: NEGATIVE mg/dL
pH: 6 (ref 5.0–8.0)

## 2015-04-12 LAB — POCT PREGNANCY, URINE: PREG TEST UR: NEGATIVE

## 2015-04-12 LAB — CBC
HEMATOCRIT: 39.8 % (ref 36.0–46.0)
Hemoglobin: 13.6 g/dL (ref 12.0–15.0)
MCH: 31 pg (ref 26.0–34.0)
MCHC: 34.2 g/dL (ref 30.0–36.0)
MCV: 90.7 fL (ref 78.0–100.0)
Platelets: 331 10*3/uL (ref 150–400)
RBC: 4.39 MIL/uL (ref 3.87–5.11)
RDW: 13.5 % (ref 11.5–15.5)
WBC: 10.2 10*3/uL (ref 4.0–10.5)

## 2015-04-12 LAB — URINE MICROSCOPIC-ADD ON

## 2015-04-12 NOTE — MAU Provider Note (Signed)
History     CSN: [id number]  Arrival date and time: 04/12/15 2039   First Provider Initiated Contact with Patient 04/12/15 2226       Chief Complaint  Patient presents with  . Abdominal Pain   HPI  Alba Destineshlee Odom Freund is a 30 y.o. female who presents for abdominal pain Reports RLQ pain x 2 days that comes & goes. Describes as sharp pain that radiates to her vagina. Has been treating with ibuprofen with minimal relief. Reports vaginal pressure while sitting down. States that pain is similar to ovulation pain but a little more intense. Rates pain 5/10 when it comes.  LMP 04/10/15.  Is currently going to Dr. April MansonYalcinkaya for fertility treatment.  Denies n/v/d, constipation, fever, dysuria, or vaginal discharge.    OB History    No data available      Past Medical History  Diagnosis Date  . Polycystic ovarian syndrome   . Anxiety   . Infertility, female     Past Surgical History  Procedure Laterality Date  . No past surgeries      Family History  Problem Relation Age of Onset  . Anxiety disorder Mother   . Anxiety disorder Brother     Social History  Substance Use Topics  . Smoking status: Never Smoker   . Smokeless tobacco: Never Used  . Alcohol Use: Yes     Comment: rarely    Allergies:  Allergies  Allergen Reactions  . Amoxicillin Anaphylaxis and Other (See Comments)    Has patient had a PCN reaction causing immediate rash, facial/tongue/throat swelling, SOB or lightheadedness with hypotension: Yes Has patient had a PCN reaction causing severe rash involving mucus membranes or skin necrosis: No Has patient had a PCN reaction that required hospitalization No Has patient had a PCN reaction occurring within the last 10 years: Yes If all of the above answers are "NO", then may proceed with Cephalosporin use.  . Codeine Itching and Rash    Prescriptions prior to admission  Medication Sig Dispense Refill Last Dose  . Choriogonadotropin Alfa (OVIDREL) 250 MCG/0.5ML  injection Inject 250 mcg into the skin every 30 (thirty) days.   03/26/2015 at unknown   . metFORMIN (GLUCOPHAGE) 1000 MG tablet Take 1,000 mg by mouth 2 (two) times daily with a meal.   04/12/2015 at Unknown time  . Multiple Vitamin (MULTIVITAMIN WITH MINERALS) TABS Take 1 tablet by mouth daily.   04/12/2015 at Unknown time  . naproxen sodium (ANAPROX) 220 MG tablet Take 440 mg by mouth 2 (two) times daily as needed (for pain).   04/12/2015 at 1200    Review of Systems  Constitutional: Negative.   Gastrointestinal: Positive for abdominal pain. Negative for nausea, vomiting, diarrhea, constipation and blood in stool.  Genitourinary: Negative.    Physical Exam   Blood pressure 150/76, pulse 86, temperature 97.8 F (36.6 C), temperature source Oral, resp. rate 20, height 5\' 6"  (1.676 m), weight 260 lb (117.935 kg), last menstrual period 04/10/2015.  Physical Exam  Nursing note and vitals reviewed. Constitutional: She is oriented to person, place, and time. She appears well-developed and well-nourished. No distress.  HENT:  Head: Normocephalic and atraumatic.  Eyes: Conjunctivae are normal. Right eye exhibits no discharge. Left eye exhibits no discharge. No scleral icterus.  Neck: Normal range of motion.  Cardiovascular: Normal rate, regular rhythm and normal heart sounds.   No murmur heard. Respiratory: Effort normal and breath sounds normal. No respiratory distress. She has no wheezes.  GI:  Soft. Bowel sounds are normal. She exhibits no distension. There is no tenderness. There is no rebound and no guarding.  Neurological: She is alert and oriented to person, place, and time.  Skin: Skin is warm and dry. She is not diaphoretic.  Psychiatric: She has a normal mood and affect. Her behavior is normal. Judgment and thought content normal.    MAU Course  Procedures Results for orders placed or performed during the hospital encounter of 04/12/15 (from the past 24 hour(s))  Urinalysis, Routine w  reflex microscopic (not at [facility])     Status: Abnormal   Collection Time: 04/12/15  9:10 PM  Result Value Ref Range   Color, Urine YELLOW YELLOW   APPearance CLEAR CLEAR   Specific Gravity, Urine <1.005 (L) 1.005 - 1.030   pH 6.0 5.0 - 8.0   Glucose, UA NEGATIVE NEGATIVE mg/dL   Hgb urine dipstick MODERATE (A) NEGATIVE   Bilirubin Urine NEGATIVE NEGATIVE   Ketones, ur NEGATIVE NEGATIVE mg/dL   Protein, ur NEGATIVE NEGATIVE mg/dL   Nitrite NEGATIVE NEGATIVE   Leukocytes, UA NEGATIVE NEGATIVE  Urine microscopic-add on     Status: Abnormal   Collection Time: 04/12/15  9:10 PM  Result Value Ref Range   Squamous Epithelial / LPF 0-5 (A) NONE SEEN   WBC, UA 0-5 0 - 5 WBC/hpf   RBC / HPF 0-5 0 - 5 RBC/hpf   Bacteria, UA RARE (A) NONE SEEN  Pregnancy, urine POC     Status: None   Collection Time: 04/12/15  9:47 PM  Result Value Ref Range   Preg Test, Ur NEGATIVE NEGATIVE  CBC     Status: None   Collection Time: 04/12/15 10:49 PM  Result Value Ref Range   WBC 10.2 4.0 - 10.5 K/uL   RBC 4.39 3.87 - 5.11 MIL/uL   Hemoglobin 13.6 12.0 - 15.0 g/dL   HCT 16.1 09.6 - 04.5 %   MCV 90.7 78.0 - 100.0 fL   MCH 31.0 26.0 - 34.0 pg   MCHC 34.2 30.0 - 36.0 g/dL   RDW 40.9 81.1 - 91.4 %   Platelets 331 150 - 400 K/uL   US Transvaginal Non-ob  04/12/2015  CLINICAL DATA:  Right lower quadrant abdominal pain. EXAM: TRANSABDOMINAL AND TRANSVAGINAL ULTRASOUND OF PELVIS TECHNIQUE: Both transabdominal and transvaginal ultrasound examinations of the pelvis were performed. Transabdominal technique was performed for global imaging of the pelvis including uterus, ovaries, adnexal regions, and pelvic cul-de-sac. It was necessary to proceed with endovaginal exam following the transabdominal exam to visualize the ovaries. COMPARISON:  05/24/2011 FINDINGS: Uterus Measurements: 7.7 x 3.6 x 5.1 cm. No fibroids or other mass visualized. Arcuate configured uterus. Endometrium Thickness: 6.5 mm.  No focal abnormality  visualized. Right ovary Measurements: 3.6 x 2.8 x 2.7 cm. Normal appearance/no adnexal mass. Left ovary Measurements: 3.4 x 2.4 x 2.8 cm. Normal appearance/no adnexal mass. Other findings No abnormal free fluid. IMPRESSION: 1. No acute findings and no explanation for right lower quadrant pain. 2. Arcuate uterus. Electronically Signed   By: Signa Kell M.D.   On: 04/12/2015 23:36   US Pelvis Complete  04/12/2015  CLINICAL DATA:  Right lower quadrant abdominal pain. EXAM: TRANSABDOMINAL AND TRANSVAGINAL ULTRASOUND OF PELVIS TECHNIQUE: Both transabdominal and transvaginal ultrasound examinations of the pelvis were performed. Transabdominal technique was performed for global imaging of the pelvis including uterus, ovaries, adnexal regions, and pelvic cul-de-sac. It was necessary to proceed with endovaginal exam following the transabdominal exam to visualize the  ovaries. COMPARISON:  05/24/2011 FINDINGS: Uterus Measurements: 7.7 x 3.6 x 5.1 cm. No fibroids or other mass visualized. Arcuate configured uterus. Endometrium Thickness: 6.5 mm.  No focal abnormality visualized. Right ovary Measurements: 3.6 x 2.8 x 2.7 cm. Normal appearance/no adnexal mass. Left ovary Measurements: 3.4 x 2.4 x 2.8 cm. Normal appearance/no adnexal mass. Other findings No abnormal free fluid. IMPRESSION: 1. No acute findings and no explanation for right lower quadrant pain. 2. Arcuate uterus. Electronically Signed   By: Signa Kell M.D.   On: 04/12/2015 23:36    MDM UPT negative CBC, normal U/a normal Ultrasound - normal S/w Dr. Marcelle Overlie regarding patient. Ok to discharge home.  Assessment and Plan  A: 1. Dysmenorrhea   2. RLQ abdominal pain     P; Discharge home F/u with Dr. Lyndal Rainbow office Continue to take OTC meds prn pain per bottle instructions  Judeth Horn 04/12/2015, 10:26 PM

## 2015-04-12 NOTE — MAU Note (Signed)
Started my period few days ago. Yest started having pain R ovary area. A lot of pressure in pelvis. Pain is sharp and comes and goes. Very uncomfortable. Seeing fertility specialist. Pain is like ovulation pain but more intense and with the pressure.

## 2015-04-13 DIAGNOSIS — N946 Dysmenorrhea, unspecified: Secondary | ICD-10-CM | POA: Diagnosis not present

## 2015-04-13 NOTE — Discharge Instructions (Signed)

## 2015-12-26 ENCOUNTER — Ambulatory Visit (INDEPENDENT_AMBULATORY_CARE_PROVIDER_SITE_OTHER): Payer: Self-pay | Admitting: Physician Assistant

## 2015-12-26 ENCOUNTER — Ambulatory Visit (INDEPENDENT_AMBULATORY_CARE_PROVIDER_SITE_OTHER): Payer: Self-pay

## 2015-12-26 VITALS — BP 136/80 | HR 109 | Temp 99.4°F | Resp 18 | Ht 66.0 in | Wt 260.0 lb

## 2015-12-26 DIAGNOSIS — R05 Cough: Secondary | ICD-10-CM

## 2015-12-26 DIAGNOSIS — R69 Illness, unspecified: Secondary | ICD-10-CM

## 2015-12-26 DIAGNOSIS — R059 Cough, unspecified: Secondary | ICD-10-CM

## 2015-12-26 DIAGNOSIS — J111 Influenza due to unidentified influenza virus with other respiratory manifestations: Secondary | ICD-10-CM

## 2015-12-26 DIAGNOSIS — R52 Pain, unspecified: Secondary | ICD-10-CM

## 2015-12-26 LAB — POCT CBC
Granulocyte percent: 80.3 %G — AB (ref 37–80)
HCT, POC: 44 % (ref 37.7–47.9)
Hemoglobin: 15.7 g/dL (ref 12.2–16.2)
LYMPH, POC: 0.9 (ref 0.6–3.4)
MCH, POC: 31.9 pg — AB (ref 27–31.2)
MCHC: 35.7 g/dL — AB (ref 31.8–35.4)
MCV: 89.4 fL (ref 80–97)
MID (CBC): 0.2 (ref 0–0.9)
MPV: 8.6 fL (ref 0–99.8)
PLATELET COUNT, POC: 212 10*3/uL (ref 142–424)
POC Granulocyte: 4.7 (ref 2–6.9)
POC LYMPH %: 15.7 % (ref 10–50)
POC MID %: 4 %M (ref 0–12)
RBC: 4.92 M/uL (ref 4.04–5.48)
RDW, POC: 13.7 %
WBC: 5.8 10*3/uL (ref 4.6–10.2)

## 2015-12-26 MED ORDER — OSELTAMIVIR PHOSPHATE 75 MG PO CAPS: 75.0000 mg | ORAL_CAPSULE | Freq: Two times a day (BID) | ORAL | 0 refills | Status: DC

## 2015-12-26 MED ORDER — PROMETHAZINE-DM 6.25-15 MG/5ML PO SYRP: 5.0000 mL | ORAL_SOLUTION | Freq: Four times a day (QID) | ORAL | 0 refills | Status: DC | PRN

## 2015-12-26 MED ORDER — AZITHROMYCIN 250 MG PO TABS: ORAL_TABLET | ORAL | 0 refills | Status: DC

## 2015-12-26 NOTE — Progress Notes (Signed)
MRN: [id number] DOB: 30-Dec-1985  Subjective:   Colleen Weber is a 30 y.o. female presenting for chief complaint of Fever; Generalized Body Aches; and Cough . Pt notes she was sick with a head cold and cough about a month ago, the cough had almost subsided but last night developed a sudden fever, chills, generalized body aches and the dry cough returned.  Has tried motrin for relief of fever last night. Denies sinus congestion, sinus pain, ear fullness, sore throat, wheezing and shortness of breath, nausea, vomiting and diarrhea. Has had sick contact with young children. Has history of seasonal allergies, no history of asthma. Patient has not had flu shot this season. No smoking or alcohol use. Denies any other aggravating or relieving factors, no other questions or concerns. Pt has history of flu. Notes this is similar in nature but does not feel as terrible as she did the first time she had the flu.   Camerin has a current medication list which includes the following prescription(s): multivitamin. Also is allergic to amoxicillin and codeine.  Shamara  has a past medical history of Anxiety; Infertility, female; and Polycystic ovarian syndrome. Also  has a past surgical history that includes No past surgeries.   Objective:   Vitals: BP 136/80 (BP Location: Right Arm, Patient Position: Sitting, Cuff Size: Large)   Pulse (!) 109   Temp 99.4 F (37.4 C) (Oral)   Resp 18   Ht 5\' 6"  (1.676 m)   Wt 260 lb (117.9 kg)   LMP 12/12/2015   SpO2 99%   BMI 41.97 kg/m   Physical Exam  Constitutional: She is oriented to person, place, and time. She appears well-developed and well-nourished. She appears distressed (mild, lying on exam table, appears exhausted).  HENT:  Head: Normocephalic and atraumatic.  Right Ear: External ear and ear canal normal. Tympanic membrane is erythematous.  Left Ear: Tympanic membrane, external ear and ear canal normal.  Nose: Mucosal edema and rhinorrhea present.  Right sinus exhibits no maxillary sinus tenderness and no frontal sinus tenderness. Left sinus exhibits no maxillary sinus tenderness and no frontal sinus tenderness.  Mouth/Throat: Uvula is midline, oropharynx is clear and moist and mucous membranes are normal.  Eyes: Conjunctivae are normal.  Neck: Normal range of motion.  Cardiovascular: Regular rhythm and normal heart sounds.  Tachycardia present.   Pulmonary/Chest: Effort normal.  Lymphadenopathy:       Head (right side): No submental, no submandibular, no tonsillar, no preauricular, no posterior auricular and no occipital adenopathy present.       Head (left side): No submental, no submandibular, no tonsillar, no preauricular, no posterior auricular and no occipital adenopathy present.    She has no cervical adenopathy.       Right: No supraclavicular adenopathy present.       Left: No supraclavicular adenopathy present.  Neurological: She is alert and oriented to person, place, and time.  Skin: Skin is warm and dry.  Psychiatric: She has a normal mood and affect.  Vitals reviewed.   . Dg Chest 2 View  Result Date: 12/26/2015 CLINICAL DATA:  Cough EXAM: CHEST  2 VIEW COMPARISON:  None. FINDINGS: Normal heart size. Lungs clear. No pneumothorax. No pleural effusion. IMPRESSION: No active cardiopulmonary disease. Electronically Signed   By: Jolaine ClickArthur  Hoss M.D.   On: 12/26/2015 10:43   Results for orders placed or performed in visit on 12/26/15 (from the past 24 hour(s))  POCT CBC     Status: Abnormal  Collection Time: 12/26/15 10:38 AM  Result Value Ref Range   WBC 5.8 4.6 - 10.2 K/uL   Lymph, poc 0.9 0.6 - 3.4   POC LYMPH PERCENT 15.7 10 - 50 %L   MID (cbc) 0.2 0 - 0.9   POC MID % 4.0 0 - 12 %M   POC Granulocyte 4.7 2 - 6.9   Granulocyte percent 80.3 (A) 37 - 80 %G   RBC 4.92 4.04 - 5.48 M/uL   Hemoglobin 15.7 12.2 - 16.2 g/dL   HCT, POC 16.144.0 09.637.7 - 47.9 %   MCV 89.4 80 - 97 fL   MCH, POC 31.9 (A) 27 - 31.2 pg   MCHC 35.7  (A) 31.8 - 35.4 g/dL   RDW, POC 04.513.7 %   Platelet Count, POC 212 142 - 424 K/uL   MPV 8.6 0 - 99.8 fL    Assessment and Plan :  Pt's presentation is most consistent with flu like illness, will cover with Tamiflu. However, due to the chronicity of her cough, which resolved for short duration and returned, I am worried about a secondary infection. Pt given a print out prescription for azithromycin and instructed if she does not get better with tamiflu and/or she starts to get worse over the weekend to start the zpack. Pt understands and agrees to treatment plan.   1. Cough - DG Chest 2 View; Future - POCT CBC - azithromycin (ZITHROMAX) 250 MG tablet; Take 2 tabs PO x 1 dose, then 1 tab PO QD x 4 days  Dispense: 6 tablet; Refill: 0 - promethazine-dextromethorphan (PROMETHAZINE-DM) 6.25-15 MG/5ML syrup; Take 5 mLs by mouth 4 (four) times daily as needed for cough.  Dispense: 118 mL; Refill: 0 - Influenza a and b  2. Body aches 3. Influenza-like illness - oseltamivir (TAMIFLU) 75 MG capsule; Take 1 capsule (75 mg total) by mouth 2 (two) times daily.  Dispense: 10 capsule; Refill: 0  Benjiman CoreBrittany Saba Neuman, PA-C  [facility] 12/26/2015 10:46 AM

## 2015-12-26 NOTE — Patient Instructions (Addendum)
Take tamiflu as prescribed. You can use ibuprofen or tylenol for fever. If you are no better after the five days of treatment or you begin to get worse while being treated. Start taking azithromycin. Return to clinic if symptoms worsen, do not improve, or as needed  Influenza, Adult Influenza ("the flu") is an infection in the lungs, nose, and throat (respiratory tract). It is caused by a virus. The flu causes many common cold symptoms, as well as a high fever and body aches. It can make you feel very sick. The flu spreads easily from person to person (is contagious). Getting a flu shot (influenza vaccination) every year is the best way to prevent the flu. Follow these instructions at home:  Take over-the-counter and prescription medicines only as told by your doctor.  Use a cool mist humidifier to add moisture (humidity) to the air in your home. This can make it easier to breathe.  Rest as needed.  Drink enough fluid to keep your pee (urine) clear or pale yellow.  Cover your mouth and nose when you cough or sneeze.  Wash your hands with soap and water often, especially after you cough or sneeze. If you cannot use soap and water, use hand sanitizer.  Stay home from work or school as told by your doctor. Unless you are visiting your doctor, try to avoid leaving home until your fever has been gone for 24 hours without the use of medicine.  Keep all follow-up visits as told by your doctor. This is important. How is this prevented?  Getting a yearly (annual) flu shot is the best way to avoid getting the flu. You may get the flu shot in late summer, fall, or winter. Ask your doctor when you should get your flu shot.  Wash your hands often or use hand sanitizer often.  Avoid contact with people who are sick during cold and flu season.  Eat healthy foods.  Drink plenty of fluids.  Get enough sleep.  Exercise regularly. Contact a doctor if:  You get new symptoms.  You have:  Chest  pain.  Watery poop (diarrhea).  A fever.  Your cough gets worse.  You start to have more mucus.  You feel sick to your stomach (nauseous).  You throw up (vomit). Get help right away if:  You start to be short of breath or have trouble breathing.  Your skin or nails turn a bluish color.  You have very bad pain or stiffness in your neck.  You get a sudden headache.  You get sudden pain in your face or ear.  You cannot stop throwing up. This information is not intended to replace advice given to you by your health care provider. Make sure you discuss any questions you have with your health care provider. Document Released: 09/30/2007 Document Revised: 05/29/2015 Document Reviewed: 10/15/2014 Elsevier Interactive Patient Education  2017 [employer]    IF you received an x-ray today, you will receive an invoice from [facility]. Please contact [facility] at [phone] with questions or concerns regarding your invoice.   IF you received labwork today, you will receive an invoice from [facility]. Please contact [facility] at [phone] with questions or concerns regarding your invoice.   Our billing staff will not be able to assist you with questions regarding bills from these companies.  You will be contacted with the lab results as soon as they are available. The fastest way to get your results is to activate your My Chart account.  Instructions are located on the last page of this paperwork. If you have not heard from [place] regarding the results in 2 weeks, please contact this office.

## 2015-12-27 LAB — INFLUENZA A AND B
Influenza A Ag, EIA: NEGATIVE
Influenza B Ag, EIA: NEGATIVE

## 2015-12-27 LAB — PLEASE NOTE:

## 2016-06-06 ENCOUNTER — Encounter (HOSPITAL_COMMUNITY): Payer: Self-pay | Admitting: Emergency Medicine

## 2016-06-06 ENCOUNTER — Emergency Department (HOSPITAL_COMMUNITY): Payer: 59

## 2016-06-06 ENCOUNTER — Emergency Department (HOSPITAL_COMMUNITY)
Admission: EM | Admit: 2016-06-06 | Discharge: 2016-06-06 | Disposition: A | Discharge status: Home / Self Care | Payer: 59 | Attending: Emergency Medicine | Admitting: Emergency Medicine

## 2016-06-06 DIAGNOSIS — Z79899 Other long term (current) drug therapy: Secondary | ICD-10-CM | POA: Insufficient documentation

## 2016-06-06 DIAGNOSIS — Y999 Unspecified external cause status: Secondary | ICD-10-CM | POA: Insufficient documentation

## 2016-06-06 DIAGNOSIS — Y9339 Activity, other involving climbing, rappelling and jumping off: Secondary | ICD-10-CM | POA: Insufficient documentation

## 2016-06-06 DIAGNOSIS — Y929 Unspecified place or not applicable: Secondary | ICD-10-CM | POA: Insufficient documentation

## 2016-06-06 DIAGNOSIS — X509XXA Other and unspecified overexertion or strenuous movements or postures, initial encounter: Secondary | ICD-10-CM | POA: Diagnosis not present

## 2016-06-06 DIAGNOSIS — S8992XA Unspecified injury of left lower leg, initial encounter: Secondary | ICD-10-CM

## 2016-06-06 MED ORDER — MELOXICAM 7.5 MG PO TABS: 7.5000 mg | ORAL_TABLET | Freq: Every day | ORAL | 0 refills | Status: DC

## 2016-06-06 NOTE — ED Triage Notes (Signed)
Pt to ER for evaluation of left knee pain after injury from falling off of lawn mower today. States "feels like my knee is gonna give out when I put weight on it." NAD.

## 2016-06-06 NOTE — ED Provider Notes (Signed)
[facility]-EMERGENCY DEPT Provider Note   CSN: [id number] Arrival date & time: 06/06/16  1249  By signing my name below, I, Rosana Fret, attest that this documentation has been prepared under the direction and in the presence of non-physician practitioner, Russo, [place], PA-C. Electronically Signed: Rosana Fret, ED Scribe. 06/06/16. 2:44 PM.  History   Chief Complaint Chief Complaint  Patient presents with  . Knee Injury   The history is provided by the patient. No language interpreter was used.   HPI Comments: Colleen Weber is a 31 y.o. female who presents to the Emergency Department complaining of sudden onset, moderate left posterior knee pain onset prior to arrival. Pt states she jumped off the lawn mower and landed with a twist in her knee. Pain is worse with movement and weightbearing. States her knee feels stiff. Denies crepitus. Pt has tried 600 mg of Ibuprofen with moderate relief.  Denies previous injury to the knee. Denies left ankle pain or swelling. No other complaints at this time.  Past Medical History:  Diagnosis Date  . Anxiety   . Infertility, female   . Polycystic ovarian syndrome     Patient Active Problem List   Diagnosis Date Noted  . PCO (polycystic ovaries) 05/12/2012  . Anxiety     Past Surgical History:  Procedure Laterality Date  . NO PAST SURGERIES      OB History    No data available       Home Medications    Prior to Admission medications   Medication Sig Start Date End Date Taking? Authorizing Provider  azithromycin (ZITHROMAX) 250 MG tablet Take 2 tabs PO x 1 dose, then 1 tab PO QD x 4 days 12/26/15   Benjiman Core D, PA-C  meloxicam (MOBIC) 7.5 MG tablet Take 1 tablet (7.5 mg total) by mouth daily. 06/06/16   Russo, [place] N, PA-C  Multiple Vitamin (MULTIVITAMIN) tablet Take 1 tablet by mouth daily.    [provider]  oseltamivir (TAMIFLU) 75 MG capsule Take 1 capsule (75 mg total) by mouth 2 (two) times daily.  12/26/15   Benjiman Core D, PA-C  promethazine-dextromethorphan (PROMETHAZINE-DM) 6.25-15 MG/5ML syrup Take 5 mLs by mouth 4 (four) times daily as needed for cough. 12/26/15   Magdalene River, PA-C    Family History Family History  Problem Relation Age of Onset  . Anxiety disorder Mother   . Anxiety disorder Brother     Social History Social History  Substance Use Topics  . Smoking status: Never Smoker  . Smokeless tobacco: Never Used  . Alcohol use Yes     Comment: rarely     Allergies   Amoxicillin and Codeine   Review of Systems Review of Systems  Musculoskeletal: Positive for arthralgias (left knee).  Neurological: Negative for numbness.     Physical Exam Updated Vital Signs BP 135/74 (BP Location: Right Arm)   Pulse 77   Temp 98.4 F (36.9 C) (Oral)   Resp 18   Ht 5\' 6"  (1.676 m)   Wt 117.9 kg (260 lb)   SpO2 99%   BMI 41.97 kg/m   Physical Exam  Constitutional: She appears well-developed and well-nourished.  HENT:  Head: Normocephalic and atraumatic.  Eyes: Conjunctivae are normal.  Cardiovascular: Normal rate and intact distal pulses.   Pulmonary/Chest: Effort normal.  Musculoskeletal: Normal range of motion. She exhibits no tenderness.  Left Knee: No tenderness over the medial or lateral joint line. No patella tenderness. Negative apprehension test. Negative anterior posterior  drawer. Negative valgus and varus. Pain with McMurray's lateral aspect. Normal passive ROM with pain. No crepitus.  Left Ankle: Intact dorsalis pedis pulse. Normal sensation. Non-tender with normal ROM.  Psychiatric: She has a normal mood and affect. Her behavior is normal.  Nursing note and vitals reviewed.    ED Treatments / Results  DIAGNOSTIC STUDIES: Oxygen Saturation is 96% on RA, normal by my interpretation.   COORDINATION OF CARE: 2:36 PM-Discussed next steps with pt including immobilization with a knee brace, antiinflammatory medication and follow up  with ortho. Pt verbalized understanding and is agreeable with the plan.   Labs (all labs ordered are listed, but only abnormal results are displayed) Labs Reviewed - No data to display  EKG  EKG Interpretation None       Radiology Dg Knee Complete 4 Views Left  Result Date: 06/06/2016 CLINICAL DATA:  Left knee pain after jumping off of a riding lawnmower. Initial encounter. EXAM: LEFT KNEE - COMPLETE 4+ VIEW COMPARISON:  None. FINDINGS: No evidence of fracture, dislocation, or joint effusion. No evidence of arthropathy or other focal bone abnormality. Soft tissues are unremarkable. IMPRESSION: Negative. Electronically Signed   By: Marnee SpringJonathon  Watts M.D.   On: 06/06/2016 13:24    Procedures Procedures (including critical care time)  Medications Ordered in ED Medications - No data to display   Initial Impression / Assessment and Plan / ED Course  I have reviewed the triage vital signs and the nursing notes.  Pertinent labs & imaging results that were available during my care of the patient were reviewed by me and considered in my medical decision making (see chart for details).     Patient knee injury, suspect possible internal derangement. X-Ray negative for obvious fracture or dislocation. Knee is stable on exam. Vascularly intact. Knee placed in an immobilizer, crutches given. Pt advised to follow up with orthopedics. Conservative therapy recommended and discussed. Patient will be dc home & is agreeable with above plan.  Discussed results, findings, treatment and follow up. Patient advised of return precautions. Patient verbalized understanding and agreed with plan.  Final Clinical Impressions(s) / ED Diagnoses   Final diagnoses:  Left knee injury, initial encounter    New Prescriptions Discharge Medication List as of 06/06/2016  2:46 PM    START taking these medications   Details  meloxicam (MOBIC) 7.5 MG tablet Take 1 tablet (7.5 mg total) by mouth daily., Starting Sun  06/06/2016, Print       I personally performed the services described in this documentation, which was scribed in my presence. The recorded information has been reviewed and is accurate.    Russo, [place] N, PA-C 06/06/16 1714    Raeford RazorKohut, Stephen, MD 06/06/16 2012

## 2016-06-06 NOTE — Discharge Instructions (Signed)
Please read instructions below. Apply ice to your knee for 20 minutes at a time. Elevate it when possible. You can take meloxicam daily, with food, as needed for pain. Schedule an appointment with the orthopedic specialist within 1 week for follow-up on your injury. Keep knee brace on at all times until your follow up appointment with specialist. Return to the ER for new or concerning symptoms.

## 2016-07-09 ENCOUNTER — Encounter (HOSPITAL_COMMUNITY): Payer: Self-pay | Admitting: Emergency Medicine

## 2016-07-09 ENCOUNTER — Emergency Department (HOSPITAL_COMMUNITY)
Admission: EM | Admit: 2016-07-09 | Discharge: 2016-07-09 | Disposition: A | Discharge status: Home / Self Care | Payer: 59 | Attending: Emergency Medicine | Admitting: Emergency Medicine

## 2016-07-09 DIAGNOSIS — K5909 Other constipation: Secondary | ICD-10-CM | POA: Insufficient documentation

## 2016-07-09 DIAGNOSIS — K5903 Drug induced constipation: Secondary | ICD-10-CM

## 2016-07-09 DIAGNOSIS — T402X5A Adverse effect of other opioids, initial encounter: Secondary | ICD-10-CM

## 2016-07-09 MED ORDER — SORBITOL 70 % SOLN
960.0000 mL | TOPICAL_OIL | Freq: Once | ORAL | Status: AC
  Administered 2016-07-09: 960 mL via RECTAL
  Filled 2016-07-09: qty 240

## 2016-07-09 NOTE — ED Notes (Signed)
Pt states she understands instructions. Home stable with steady gait with husband. 

## 2016-07-09 NOTE — Discharge Instructions (Signed)
Please stop taking the tramadol as this is worsening and likely the cause of your constipation.  Take magnesium citrate 150mg  every 12 hours. You should continue to take Senakot. If this you have no relief in the the next 24 hours you should start taking miralax until you have a bowel movement. You may also try a fleet enema which are available over -the counter.   Please follow with your primary care doctor in the next 2 days for a check-up. They must obtain records for further management.   Do not hesitate to return to the Emergency Department for any new, worsening or concerning symptoms.

## 2016-07-09 NOTE — ED Triage Notes (Signed)
Patient reports constipation for several days with rectal pressure , denies abdominal pain or nausea . Last BM 4 days ago.

## 2016-07-09 NOTE — ED Provider Notes (Signed)
[facility] Provider Note   CSN: [id number] Arrival date & time: 07/09/16  4098     History   Chief Complaint Chief Complaint  Patient presents with  . Constipation    HPI   Blood pressure (!) 141/92, pulse (!) 104, temperature 98 F (36.7 C), temperature source Oral, resp. rate 18, SpO2 95 %.  Colleen Weber is a 31 y.o. female complaining of constipation onset approximately 4 days ago with rectal pain. She's been taking tramadol for anterior cruciate ligament repair 3 weeks ago by Dr. Kevan Ny. She denies any fever, chills, nausea, vomiting, abdominal pain. She states that she tried to self disimpact and took several laxatives without relief. She is passing flatus normally. She is eating normally.  Past Medical History:  Diagnosis Date  . Anxiety   . Infertility, female   . Polycystic ovarian syndrome     Patient Active Problem List   Diagnosis Date Noted  . PCO (polycystic ovaries) 05/12/2012  . Anxiety     Past Surgical History:  Procedure Laterality Date  . ANTERIOR CRUCIATE LIGAMENT REPAIR    . NO PAST SURGERIES      OB History    No data available       Home Medications    Prior to Admission medications   Medication Sig Start Date End Date Taking? Authorizing Provider  azithromycin (ZITHROMAX) 250 MG tablet Take 2 tabs PO x 1 dose, then 1 tab PO QD x 4 days 12/26/15   Benjiman Core D, PA-C  meloxicam (MOBIC) 7.5 MG tablet Take 1 tablet (7.5 mg total) by mouth daily. 06/06/16   Russo, [place] N, PA-C  Multiple Vitamin (MULTIVITAMIN) tablet Take 1 tablet by mouth daily.    [provider]  oseltamivir (TAMIFLU) 75 MG capsule Take 1 capsule (75 mg total) by mouth 2 (two) times daily. 12/26/15   Benjiman Core D, PA-C  promethazine-dextromethorphan (PROMETHAZINE-DM) 6.25-15 MG/5ML syrup Take 5 mLs by mouth 4 (four) times daily as needed for cough. 12/26/15   Magdalene River, PA-C    Family History Family History  Problem Relation  Age of Onset  . Anxiety disorder Mother   . Anxiety disorder Brother     Social History Social History  Substance Use Topics  . Smoking status: Never Smoker  . Smokeless tobacco: Never Used  . Alcohol use Yes     Comment: rarely     Allergies   Amoxicillin and Codeine   Review of Systems Review of Systems  A complete review of systems was obtained and all systems are negative except as noted in the HPI and PMH.    Physical Exam Updated Vital Signs BP 124/64 (BP Location: Right Arm)   Pulse 90   Temp 98 F (36.7 C) (Oral)   Resp 18   SpO2 96%   Physical Exam  Constitutional: She is oriented to person, place, and time. She appears well-developed and well-nourished. No distress.  HENT:  Head: Normocephalic and atraumatic.  Mouth/Throat: Oropharynx is clear and moist.  Eyes: Conjunctivae and EOM are normal. Pupils are equal, round, and reactive to light.  Neck: Normal range of motion.  Cardiovascular: Normal rate, regular rhythm and intact distal pulses.   Pulmonary/Chest: Effort normal and breath sounds normal.  Abdominal: Soft. She exhibits no distension and no mass. There is no tenderness. There is no rebound and no guarding. No hernia.  Genitourinary:  Genitourinary Comments: Disimpaction chaperoned by RN: No rashes or lesions, no fissures or gross hemorrhoids.  Musculoskeletal: Normal range of motion.  Neurological: She is alert and oriented to person, place, and time.  Skin: She is not diaphoretic.  Psychiatric: She has a normal mood and affect.  Nursing note and vitals reviewed.    ED Treatments / Results  Labs (all labs ordered are listed, but only abnormal results are displayed) Labs Reviewed - No data to display  EKG  EKG Interpretation None       Radiology No results found.  Procedures Fecal disimpaction Date/Time: 07/09/2016 7:29 AM Performed by: Wynetta EmeryPISCIOTTA, Kiel Cockerell Authorized by: Wynetta EmeryPISCIOTTA, Dahir Ayer  Consent: Verbal consent obtained. Risks  and benefits: risks, benefits and alternatives were discussed Consent given by: patient Patient identity confirmed: verbally with patient Local anesthesia used: no  Anesthesia: Local anesthesia used: no  Sedation: Patient sedated: no Patient tolerance: Patient tolerated the procedure well with no immediate complications    (including critical care time)  Medications Ordered in ED Medications  sorbitol, milk of mag, mineral oil, glycerin (SMOG) enema (960 mLs Rectal Given 07/09/16 [id number])     Initial Impression / Assessment and Plan / ED Course  I have reviewed the triage vital signs and the nursing notes.  Pertinent labs & imaging results that were available during my care of the patient were reviewed by me and considered in my medical decision making (see chart for details).     Vitals:   07/09/16 0654 07/09/16 0836  BP: (!) 141/92 124/64  Pulse: (!) 104 90  Resp: 18 18  Temp: 98 F (36.7 C)   TempSrc: Oral   SpO2: 95% 96%    Medications  sorbitol, milk of mag, mineral oil, glycerin (SMOG) enema (960 mLs Rectal Given 07/09/16 0828)    Colleen Weber is 31 y.o. female presenting with Constipation for the course of 4 days. At patient's request disimpaction was performed, patient would also like an enema while in the ED. Milk of molasses enema given with some relief. Advised her to DC her tramadol. Abdominal exam is benign. Likely a reviewed and is constipation from her recent use of tramadol status post surgery 2 weeks ago for anterior cruciate ligament repair.  Evaluation does not show pathology that would require ongoing emergent intervention or inpatient treatment. Pt is hemodynamically stable and mentating appropriately. Discussed findings and plan with patient/guardian, who agrees with care plan. All questions answered. Return precautions discussed and outpatient follow up given.      Final Clinical Impressions(s) / ED Diagnoses   Final diagnoses:  Constipation  due to opioid therapy    New Prescriptions New Prescriptions   No medications on file     Kaylyn Limisciotta, Kiwan [place], PA-C 07/09/16 [id number]    Loren RacerYelverton, David, MD 07/13/16 [phone]

## 2019-07-12 ENCOUNTER — Emergency Department (HOSPITAL_BASED_OUTPATIENT_CLINIC_OR_DEPARTMENT_OTHER)
Admission: EM | Admit: 2019-07-12 | Discharge: 2019-07-12 | Disposition: A | Discharge status: Home / Self Care | Payer: 59 | Attending: Emergency Medicine | Admitting: Emergency Medicine

## 2019-07-12 ENCOUNTER — Encounter (HOSPITAL_BASED_OUTPATIENT_CLINIC_OR_DEPARTMENT_OTHER): Payer: Self-pay | Admitting: Emergency Medicine

## 2019-07-12 ENCOUNTER — Other Ambulatory Visit: Payer: Self-pay

## 2019-07-12 DIAGNOSIS — R110 Nausea: Secondary | ICD-10-CM | POA: Insufficient documentation

## 2019-07-12 DIAGNOSIS — R42 Dizziness and giddiness: Secondary | ICD-10-CM | POA: Diagnosis not present

## 2019-07-12 DIAGNOSIS — R002 Palpitations: Secondary | ICD-10-CM | POA: Diagnosis present

## 2019-07-12 DIAGNOSIS — R000 Tachycardia, unspecified: Secondary | ICD-10-CM | POA: Diagnosis not present

## 2019-07-12 LAB — CBC WITH DIFFERENTIAL/PLATELET
Abs Immature Granulocytes: 0.06 10*3/uL (ref 0.00–0.07)
Basophils Absolute: 0 10*3/uL (ref 0.0–0.1)
Basophils Relative: 0 %
Eosinophils Absolute: 0.5 10*3/uL (ref 0.0–0.5)
Eosinophils Relative: 4 %
HCT: 45.1 % (ref 36.0–46.0)
Hemoglobin: 15.2 g/dL — ABNORMAL HIGH (ref 12.0–15.0)
Immature Granulocytes: 1 %
Lymphocytes Relative: 26 %
Lymphs Abs: 3.2 10*3/uL (ref 0.7–4.0)
MCH: 31.1 pg (ref 26.0–34.0)
MCHC: 33.7 g/dL (ref 30.0–36.0)
MCV: 92.4 fL (ref 80.0–100.0)
Monocytes Absolute: 1.1 10*3/uL — ABNORMAL HIGH (ref 0.1–1.0)
Monocytes Relative: 9 %
Neutro Abs: 7.4 10*3/uL (ref 1.7–7.7)
Neutrophils Relative %: 60 %
Platelets: 402 10*3/uL — ABNORMAL HIGH (ref 150–400)
RBC: 4.88 MIL/uL (ref 3.87–5.11)
RDW: 12.8 % (ref 11.5–15.5)
WBC: 12.2 10*3/uL — ABNORMAL HIGH (ref 4.0–10.5)
nRBC: 0 % (ref 0.0–0.2)

## 2019-07-12 LAB — PREGNANCY, URINE: Preg Test, Ur: NEGATIVE

## 2019-07-12 LAB — RAPID URINE DRUG SCREEN, HOSP PERFORMED
Amphetamines: NOT DETECTED
Barbiturates: NOT DETECTED
Benzodiazepines: NOT DETECTED
Cocaine: NOT DETECTED
Opiates: NOT DETECTED
Tetrahydrocannabinol: NOT DETECTED

## 2019-07-12 LAB — TSH: TSH: 1.382 u[IU]/mL (ref 0.350–4.500)

## 2019-07-12 LAB — COMPREHENSIVE METABOLIC PANEL
ALT: 19 U/L (ref 0–44)
AST: 21 U/L (ref 15–41)
Albumin: 4.1 g/dL (ref 3.5–5.0)
Alkaline Phosphatase: 79 U/L (ref 38–126)
Anion gap: 11 (ref 5–15)
BUN: 13 mg/dL (ref 6–20)
CO2: 23 mmol/L (ref 22–32)
Calcium: 8.9 mg/dL (ref 8.9–10.3)
Chloride: 102 mmol/L (ref 98–111)
Creatinine, Ser: 0.71 mg/dL (ref 0.44–1.00)
GFR calc Af Amer: >60 mL/min (ref >60)
GFR calc non Af Amer: >60 mL/min (ref >60)
Glucose, Bld: 127 mg/dL — ABNORMAL HIGH (ref 70–99)
Potassium: 3.9 mmol/L (ref 3.5–5.1)
Sodium: 136 mmol/L (ref 135–145)
Total Bilirubin: 0.3 mg/dL (ref 0.3–1.2)
Total Protein: 7.6 g/dL (ref 6.5–8.1)

## 2019-07-12 LAB — T4, FREE: Free T4: 0.74 ng/dL (ref 0.61–1.12)

## 2019-07-12 MED ORDER — SODIUM CHLORIDE 0.9 % IV BOLUS
1000.0000 mL | Freq: Once | INTRAVENOUS | Status: AC
  Administered 2019-07-12: 1000 mL via INTRAVENOUS

## 2019-07-12 NOTE — ED Notes (Signed)
Pt reports took xanax 0.5 about 2 hours ago without improvement in symptoms

## 2019-07-12 NOTE — ED Provider Notes (Signed)
[facility] EMERGENCY DEPARTMENT Provider Note   CSN: [id number] Arrival date & time: 07/12/19  1236     History Chief Complaint  Patient presents with  . Anxiety  . Palpitations    Colleen Weber is a 34 y.o. female.  HPI     Episodes of palpitations over last few weeks, and episode started today around 1230PM. Whole body feels cold when palpitatoins start.  Was just laying on the couch when it started.   Feels lightheaded, cold all over body, cold sweats but not all the time.   No chest pain or shortness of breath.  Started menstruating the other day, it has been heavy.  Nausea associated No vomiting, no diarrhea, no black or bloody stool  Before was having anxiety symptom would have these but palpitations worse now. Just started venlafaxine 75mg  2 days ago Occ etoh Took xanax today , no history of regular use, no other drugs No hx of thyroid problems   Past Medical History:  Diagnosis Date  . Anxiety   . Infertility, female   . Polycystic ovarian syndrome     Patient Active Problem List   Diagnosis Date Noted  . PCO (polycystic ovaries) 05/12/2012  . Anxiety     Past Surgical History:  Procedure Laterality Date  . ANTERIOR CRUCIATE LIGAMENT REPAIR    . NO PAST SURGERIES       OB History   No obstetric history on file.     Family History  Problem Relation Age of Onset  . Anxiety disorder Mother   . Anxiety disorder Brother     Social History   Tobacco Use  . Smoking status: Never Smoker  . Smokeless tobacco: Never Used  Substance Use Topics  . Alcohol use: Yes    Comment: rarely  . Drug use: No    Home Medications Prior to Admission medications   Medication Sig Start Date End Date Taking? Authorizing Provider  azithromycin (ZITHROMAX) 250 MG tablet Take 2 tabs PO x 1 dose, then 1 tab PO QD x 4 days 12/26/15   12/28/15 D, PA-C  meloxicam (MOBIC) 7.5 MG tablet Take 1 tablet (7.5 mg total) by mouth daily. 06/06/16    Robinson, 08/06/16 N, PA-C  Multiple Vitamin (MULTIVITAMIN) tablet Take 1 tablet by mouth daily.    [provider]  oseltamivir (TAMIFLU) 75 MG capsule Take 1 capsule (75 mg total) by mouth 2 (two) times daily. 12/26/15   12/28/15 D, PA-C  promethazine-dextromethorphan (PROMETHAZINE-DM) 6.25-15 MG/5ML syrup Take 5 mLs by mouth 4 (four) times daily as needed for cough. 12/26/15   12/28/15 D, PA-C    Allergies    Amoxicillin and Codeine  Review of Systems   Review of Systems  Constitutional: Negative for fever.  HENT: Negative for sore throat.   Eyes: Negative for visual disturbance.  Respiratory: Negative for cough and shortness of breath.   Cardiovascular: Negative for chest pain.  Gastrointestinal: Negative for abdominal pain, nausea and vomiting.  Genitourinary: Negative for difficulty urinating.  Musculoskeletal: Negative for back pain and neck pain.  Skin: Negative for rash.  Neurological: Negative for syncope, numbness and headaches (feels like a sinus headache).    Physical Exam Updated Vital Signs BP (!) 146/95 Comment: room air  Pulse 87   Temp 98.3 F (36.8 C)   Resp 14   Ht 5\' 6"  (1.676 m)   Wt 117.9 kg   SpO2 99%   BMI 41.97 kg/m   Physical  Exam Vitals and nursing note reviewed.  Constitutional:      General: She is not in acute distress.    Appearance: She is well-developed. She is not diaphoretic.  HENT:     Head: Normocephalic and atraumatic.  Eyes:     Conjunctiva/sclera: Conjunctivae normal.  Cardiovascular:     Rate and Rhythm: Regular rhythm. Tachycardia present.     Heart sounds: Normal heart sounds. No murmur heard.  No friction rub. No gallop.   Pulmonary:     Effort: Pulmonary effort is normal. No respiratory distress.     Breath sounds: Normal breath sounds. No wheezing or rales.  Abdominal:     General: There is no distension.     Palpations: Abdomen is soft.     Tenderness: There is no abdominal tenderness.  There is no guarding.  Musculoskeletal:        General: No tenderness.     Cervical back: Normal range of motion.  Skin:    General: Skin is warm and dry.     Findings: No erythema or rash.  Neurological:     Mental Status: She is alert and oriented to person, place, and time.     ED Results / Procedures / Treatments   Labs (all labs ordered are listed, but only abnormal results are displayed) Labs Reviewed  CBC WITH DIFFERENTIAL/PLATELET - Abnormal; Notable for the following components:      Result Value   WBC 12.2 (*)    Hemoglobin 15.2 (*)    Platelets 402 (*)    Monocytes Absolute 1.1 (*)    All other components within normal limits  COMPREHENSIVE METABOLIC PANEL - Abnormal; Notable for the following components:   Glucose, Bld 127 (*)    All other components within normal limits  TSH  PREGNANCY, URINE  RAPID URINE DRUG SCREEN, HOSP PERFORMED  T4, FREE    EKG EKG Interpretation  Date/Time:  Thursday July 12 2019 12:53:07 EDT Ventricular Rate:  121 PR Interval:    QRS Duration: 129 QT Interval:  352 QTC Calculation: 500 R Axis:   47 Text Interpretation: Sinus tachycardia Atrial premature complex Nonspecific intraventricular conduction delay Baseline wander in lead(s) II aVF V1 Since prior ECG, no significant change Confirmed by Alvira Monday ([id number]) on 07/12/2019 1:01:44 PM   Radiology No results found.  Procedures Procedures (including critical care time)  Medications Ordered in ED Medications  sodium chloride 0.9 % bolus 1,000 mL (0 mLs Intravenous Stopped 07/12/19 1431)    ED Course  I have reviewed the triage vital signs and the nursing notes.  Pertinent labs & imaging results that were available during my care of the patient were reviewed by me and considered in my medical decision making (see chart for details).    MDM Rules/Calculators/A&P                          34yo female presents with concern for tachycardia and anxiety. EKG shows sinus  tachycardia. No anemia, no electrolyte abnormalities. No history to suggest viral prodrome/myocarditis. No hx to suggest acute bacterial infection.  Doubt PE given improvement, no chest pain or dyspnea.  Thyroid studies returned and normal Pregnancy test negative.  Doubt serotonin syndrome given improvement spontaneously in ED, on 75mg  venlafaxine without other medications, afebrile. Denies other drug use.  Denies possibility of withdrawal from etoh or benzos. Side effect of venlafaxine possible but given improvement while in the ED do not feel this was  clearly etiology.  Recommend follow up with PCP for consideration of other etiologies. Patient discharged in stable condition with understanding of reasons to return.     Final Clinical Impression(s) / ED Diagnoses Final diagnoses:  Sinus tachycardia    Rx / DC Orders ED Discharge Orders    None       Alvira Monday, MD 07/12/19 [place] Fruit

## 2019-07-12 NOTE — ED Notes (Signed)
Pt. Reports she does have her "period" at this time and may have some blood in her urine.

## 2019-07-12 NOTE — ED Triage Notes (Signed)
Recently diagnosed with anxiety. States she started taking meds 2 days ago. C/o intermittent heart palpitations.

## 2019-07-12 NOTE — ED Notes (Signed)
EKG done by someone else 

## 2019-10-10 ENCOUNTER — Emergency Department (HOSPITAL_BASED_OUTPATIENT_CLINIC_OR_DEPARTMENT_OTHER): Payer: 59

## 2019-10-10 ENCOUNTER — Emergency Department (HOSPITAL_BASED_OUTPATIENT_CLINIC_OR_DEPARTMENT_OTHER)
Admission: EM | Admit: 2019-10-10 | Discharge: 2019-10-10 | Disposition: A | Discharge status: Home / Self Care | Payer: 59 | Attending: Emergency Medicine | Admitting: Emergency Medicine

## 2019-10-10 ENCOUNTER — Other Ambulatory Visit: Payer: Self-pay

## 2019-10-10 ENCOUNTER — Encounter (HOSPITAL_BASED_OUTPATIENT_CLINIC_OR_DEPARTMENT_OTHER): Payer: Self-pay | Admitting: Emergency Medicine

## 2019-10-10 DIAGNOSIS — Y92832 Beach as the place of occurrence of the external cause: Secondary | ICD-10-CM | POA: Diagnosis not present

## 2019-10-10 DIAGNOSIS — M25571 Pain in right ankle and joints of right foot: Secondary | ICD-10-CM | POA: Diagnosis not present

## 2019-10-10 DIAGNOSIS — W1839XA Other fall on same level, initial encounter: Secondary | ICD-10-CM | POA: Diagnosis not present

## 2019-10-10 DIAGNOSIS — W501XXA Accidental kick by another person, initial encounter: Secondary | ICD-10-CM | POA: Diagnosis not present

## 2019-10-10 DIAGNOSIS — Y9301 Activity, walking, marching and hiking: Secondary | ICD-10-CM | POA: Insufficient documentation

## 2019-10-10 DIAGNOSIS — M25551 Pain in right hip: Secondary | ICD-10-CM | POA: Diagnosis present

## 2019-10-10 DIAGNOSIS — M79604 Pain in right leg: Secondary | ICD-10-CM

## 2019-10-10 MED ORDER — CYCLOBENZAPRINE HCL 10 MG PO TABS
10.0000 mg | ORAL_TABLET | Freq: Two times a day (BID) | ORAL | 0 refills | Status: AC | PRN
Start: 2019-10-10 — End: ?

## 2019-10-10 MED ORDER — LIDOCAINE 5 % EX PTCH: 1.0000 | MEDICATED_PATCH | CUTANEOUS | 0 refills | Status: AC

## 2019-10-10 NOTE — ED Provider Notes (Signed)
[facility] EMERGENCY DEPARTMENT Provider Note   CSN: [id number] Arrival date & time: 10/10/19  8786     History Chief Complaint  Patient presents with  . Hip and Ankle pain    Colleen Weber is a 34 y.o. female.  The history is provided by the patient and medical records. No language interpreter was used.  Leg Pain Location:  Hip and ankle Time since incident:  1 week Injury: yes   Mechanism of injury: fall   Fall:    Fall occurred: in beach sand. Hip location:  R hip Ankle location:  R ankle Pain details:    Quality:  Aching Dislocation: no   Tetanus status:  Unknown Prior injury to area:  No Relieved by:  Nothing Worsened by:  Bearing weight Ineffective treatments:  None tried Associated symptoms: tingling   Associated symptoms: no back pain, no decreased ROM, no fatigue, no fever, no itching, no muscle weakness, no neck pain, no numbness, no stiffness and no swelling   Risk factors: no recent illness        Past Medical History:  Diagnosis Date  . Anxiety   . Infertility, female   . Polycystic ovarian syndrome     Patient Active Problem List   Diagnosis Date Noted  . PCO (polycystic ovaries) 05/12/2012  . Anxiety     Past Surgical History:  Procedure Laterality Date  . ANTERIOR CRUCIATE LIGAMENT REPAIR    . NO PAST SURGERIES       OB History   No obstetric history on file.     Family History  Problem Relation Age of Onset  . Anxiety disorder Mother   . Anxiety disorder Brother     Social History   Tobacco Use  . Smoking status: Never Smoker  . Smokeless tobacco: Never Used  Substance Use Topics  . Alcohol use: Yes    Comment: rarely  . Drug use: No    Home Medications Prior to Admission medications   Medication Sig Start Date End Date Taking? Authorizing Provider  Multiple Vitamin (MULTIVITAMIN) tablet Take 1 tablet by mouth daily.    [provider]    Allergies    Amoxicillin and Codeine  Review of  Systems   Review of Systems  Constitutional: Negative for chills, fatigue and fever.  HENT: Negative for congestion.   Respiratory: Negative for cough, chest tightness, shortness of breath and wheezing.   Cardiovascular: Negative for chest pain, palpitations and leg swelling.  Gastrointestinal: Negative for abdominal pain, constipation, diarrhea, nausea and vomiting.  Genitourinary: Negative for flank pain.  Musculoskeletal: Negative for back pain, neck pain and stiffness.  Skin: Negative for itching, rash and wound.  Neurological: Negative for headaches.  Psychiatric/Behavioral: Negative for agitation.  All other systems reviewed and are negative.   Physical Exam Updated Vital Signs BP 139/83 (BP Location: Right Arm)   Pulse (!) 101   Temp 98.2 F (36.8 C) (Oral)   Resp 16   Ht 5\' 6"  (1.676 m)   Wt 123.9 kg   LMP 10/07/2019   SpO2 100%   BMI 44.08 kg/m   Physical Exam Vitals and nursing note reviewed.  Constitutional:      General: She is not in acute distress.    Appearance: She is well-developed. She is not ill-appearing, toxic-appearing or diaphoretic.  HENT:     Head: Normocephalic and atraumatic.     Nose: Nose normal.  Eyes:     Extraocular Movements: Extraocular movements intact.  Conjunctiva/sclera: Conjunctivae normal.     Pupils: Pupils are equal, round, and reactive to light.  Cardiovascular:     Rate and Rhythm: Normal rate and regular rhythm.     Pulses: Normal pulses.     Heart sounds: No murmur heard.   Pulmonary:     Effort: Pulmonary effort is normal. No respiratory distress.     Breath sounds: Normal breath sounds. No wheezing, rhonchi or rales.  Chest:     Chest wall: No tenderness.  Abdominal:     General: Abdomen is flat.     Palpations: Abdomen is soft.     Tenderness: There is no abdominal tenderness. There is no right CVA tenderness, left CVA tenderness, guarding or rebound.  Musculoskeletal:        General: Tenderness present. No  swelling or deformity.     Cervical back: Neck supple.     Right hip: Tenderness present.     Right lower leg: Tenderness present. No edema.     Left lower leg: No edema.     Right ankle: Tenderness present.     Comments: Normal pulses and sensation.  Some mild tingling in the lateral posterior right calf.  Tenderness in the right calf.  No knee tenderness.  Mild hip tenderness.  No lumbar spine tenderness whatsoever.  Skin:    General: Skin is warm and dry.     Capillary Refill: Capillary refill takes less than 2 seconds.     Findings: No erythema or rash.  Neurological:     General: No focal deficit present.     Mental Status: She is alert.     Sensory: No sensory deficit.     Motor: No weakness.  Psychiatric:        Mood and Affect: Mood normal.     ED Results / Procedures / Treatments   Labs (all labs ordered are listed, but only abnormal results are displayed) Labs Reviewed - No data to display  EKG None  Radiology DG Ankle Complete Right  Result Date: 10/10/2019 CLINICAL DATA:  Right calf and leg pain. EXAM: RIGHT ANKLE - COMPLETE 3+ VIEW COMPARISON:  None. FINDINGS: There is no evidence of fracture, dislocation, or joint effusion. There is no evidence of arthropathy or other focal bone abnormality. Soft tissues are unremarkable. IMPRESSION: Negative. Electronically Signed   By: Kennith Center M.D.   On: 10/10/2019 08:21   US Venous Img Lower Unilateral Right  Result Date: 10/10/2019 CLINICAL DATA:  Right calf and leg pain EXAM: RIGHT LOWER EXTREMITY VENOUS DOPPLER ULTRASOUND TECHNIQUE: Gray-scale sonography with compression, as well as color and duplex ultrasound, were performed to evaluate the deep venous system(s) from the level of the common femoral vein through the popliteal and proximal calf veins. COMPARISON:  None. FINDINGS: VENOUS Normal compressibility of the common femoral, superficial femoral, and popliteal veins, as well as the visualized calf veins. Visualized  portions of profunda femoral vein and great saphenous vein unremarkable. No filling defects to suggest DVT on grayscale or color Doppler imaging. Doppler waveforms show normal direction of venous flow, normal respiratory plasticity and response to augmentation. Limited views of the contralateral common femoral vein are unremarkable. OTHER None. Limitations: none IMPRESSION: No evidence of right lower extremity DVT. Electronically Signed   By: Guadlupe Spanish M.D.   On: 10/10/2019 10:12   DG Hip Unilat W or Wo Pelvis 2-3 Views Right  Result Date: 10/10/2019 CLINICAL DATA:  Right hip pain EXAM: DG HIP (WITH OR WITHOUT PELVIS)  2-3V RIGHT COMPARISON:  None. FINDINGS: There is no evidence of hip fracture or dislocation. There is no evidence of arthropathy or other focal bone abnormality. IMPRESSION: Normal right hip radiographs. Electronically Signed   By: Duanne GuessNicholas  Plundo D.O.   On: 10/10/2019 08:22    Procedures Procedures (including critical care time)  Medications Ordered in ED Medications - No data to display  ED Course  I have reviewed the triage vital signs and the nursing notes.  Pertinent labs & imaging results that were available during my care of the patient were reviewed by me and considered in my medical decision making (see chart for details).    MDM Rules/Calculators/A&P                          Alba Destineshlee Odom Bureau is a 34 y.o. female with a past medical history sniffing for anxiety, PCOS, and prior musculoskeletal injuries who presents with right calf pain as well as right ankle pain and right hip pain.  Patient reports that she was at the beach last week and was walking on the sand and twisted her right ankle.  She reports it hurts several days later and is concerned about it.  She reports it is mild to moderate.  She reports that she was also having some pain in her right hip that is worse with movement and walking.  She denies any pain in her back.  She sought urgent care team  several days ago who thought it was likely sciatic type pain and gave her some steroids.  She reports the pain is still present but is not hurting her back.  She reports it does not shoot down her leg and is not associated with numbness going from the back.  She does say that she is having some soreness and pain in her right calf which is new as well.  She did have the long drive back from the beach but denies history of DVT or PE.  She denies edema.  She denies other persistent numbness or weakness.  She does report some mild tingling in her right calf.  She denies chest pain, shortness of breath, lightheadedness, syncope, fevers, chills, cough, urinary symptoms or GI symptoms.  She denies any other trauma.  She is able to ambulate.  On exam, lungs clear and chest nontender.  Back nontender.  Low back nontender.  Hip is tender.  Patient has good pulses, strength, and sensation.  No persistent numbness on my exam in the legs.  She has some tenderness in her right calf as well as pain with ankle manipulation and palpation.  No skin changes or laceration seen.  No bruising seen.  Had a shared decision-making conversation with patient and we agreed to get several images collected.  We will get x-ray of the right ankle and right hip to look for bony injuries after this twisted ankle and fall.  We will also get ultrasound of the right lower extremity to make sure there is no DVT.  If work-up is reassuring, anticipate discharge with written for a muscle relaxant and likely Lidoderm patch for her hip area.  Patient agrees with plan of care, anticipate reassessment.  11:25 AM Patient's ultrasound showed no evidence of DVT.  X-ray showed no evidence of ankle fracture or hip fracture.  Suspect more musculoskeletal discomfort.  She was given prescription for Lidoderm patch as well as Flexeril.  She agrees with plan of care and follow-up instructions.  She understood return precautions.  She no other questions or  concerns and patient discharged in good condition.    Final Clinical Impression(s) / ED Diagnoses Final diagnoses:  Right leg pain    Rx / DC Orders ED Discharge Orders         Ordered    lidocaine (LIDODERM) 5 %  Every 24 hours        10/10/19 1130    cyclobenzaprine (FLEXERIL) 10 MG tablet  2 times daily PRN        10/10/19 1130          Clinical Impression: 1. Right leg pain     Disposition: Discharge  Condition: Good  I have discussed the results, Dx and Tx plan with the pt(& family if present). He/she/they expressed understanding and agree(s) with the plan. Discharge instructions discussed at great length. Strict return precautions discussed and pt &/or family have verbalized understanding of the instructions. No further questions at time of discharge.    New Prescriptions   CYCLOBENZAPRINE (FLEXERIL) 10 MG TABLET    Take 1 tablet (10 mg total) by mouth 2 (two) times daily as needed for muscle spasms.   LIDOCAINE (LIDODERM) 5 %    Place 1 patch onto the skin daily. Remove & Discard patch within 12 hours or as directed by MD    Follow Up: [facility] [address] [place] [address] [phone] Schedule an appointment as soon as possible for a visit    [facility] [place] [facility] [address] [id number] [address] [place] [address] [phone]          Chestine Belknap, Canary Brim, MD 10/10/19 1131

## 2019-10-10 NOTE — ED Triage Notes (Signed)
Pt was at beach last week and took a bumpy ride through the sand and has had right hip pain ever since.  Then she twisted her right ankle walking through the sand and has pain shooting up right leg when she flexes her right ankle. Walks well.  Went to [facility] Monday and got steroids but the pain is no better.

## 2019-10-10 NOTE — Discharge Instructions (Signed)
Your work-up today did not show evidence of blood clot or any acute bony injury.  I suspect more musculoskeletal pain and spasms.  Please use the muscle relaxant and the Lidoderm patch to help with discomfort.  Please follow-up with your primary doctor.  If any symptoms change or worsen, please return to the nearest emergency department.

## 2019-10-13 ENCOUNTER — Other Ambulatory Visit: Payer: Self-pay

## 2019-10-13 ENCOUNTER — Encounter (HOSPITAL_BASED_OUTPATIENT_CLINIC_OR_DEPARTMENT_OTHER): Payer: Self-pay | Admitting: Emergency Medicine

## 2019-10-13 ENCOUNTER — Emergency Department (HOSPITAL_BASED_OUTPATIENT_CLINIC_OR_DEPARTMENT_OTHER)
Admission: EM | Admit: 2019-10-13 | Discharge: 2019-10-13 | Disposition: A | Discharge status: Home / Self Care | Payer: 59 | Attending: Emergency Medicine | Admitting: Emergency Medicine

## 2019-10-13 DIAGNOSIS — S93491D Sprain of other ligament of right ankle, subsequent encounter: Secondary | ICD-10-CM

## 2019-10-13 DIAGNOSIS — X58XXXA Exposure to other specified factors, initial encounter: Secondary | ICD-10-CM | POA: Insufficient documentation

## 2019-10-13 DIAGNOSIS — M25571 Pain in right ankle and joints of right foot: Secondary | ICD-10-CM | POA: Diagnosis present

## 2019-10-13 NOTE — ED Provider Notes (Signed)
[facility] EMERGENCY DEPARTMENT Provider Note   CSN: [id number] Arrival date & time: 10/13/19  0759     History Chief Complaint  Patient presents with  . Leg Pain    Colleen Weber is a 34 y.o. female.  34 year old female with past medical history including PCOS, anxiety who presents with right ankle pain.  Patient presented here on 10/6 for evaluation of right lower extremity pain.  She had previously been at the beach and reports that she rolled her right ankle at the time and during ED evaluation she was complaining of both ankle and right buttock pain.  X-rays and DVT ultrasound were negative.  She states that she has been using the lidocaine patches she was prescribed as well as occasional Tylenol and Motrin but her pain has not improved.  She is not having the buttock pain as much anymore but continues to have right lateral ankle pain that is worse at the end of the day.  No appreciable swelling or redness.  She has tried ice and heat without relief.  The history is provided by the patient.  Leg Pain Associated symptoms: no fever        Past Medical History:  Diagnosis Date  . Anxiety   . Infertility, female   . Polycystic ovarian syndrome     Patient Active Problem List   Diagnosis Date Noted  . PCO (polycystic ovaries) 05/12/2012  . Anxiety     Past Surgical History:  Procedure Laterality Date  . ANTERIOR CRUCIATE LIGAMENT REPAIR    . NO PAST SURGERIES       OB History   No obstetric history on file.     Family History  Problem Relation Age of Onset  . Anxiety disorder Mother   . Anxiety disorder Brother     Social History   Tobacco Use  . Smoking status: Never Smoker  . Smokeless tobacco: Never Used  Substance Use Topics  . Alcohol use: Yes    Comment: rarely  . Drug use: No    Home Medications Prior to Admission medications   Medication Sig Start Date End Date Taking? Authorizing Provider  cyclobenzaprine (FLEXERIL) 10 MG  tablet Take 1 tablet (10 mg total) by mouth 2 (two) times daily as needed for muscle spasms. 10/10/19   Tegeler, Canary Brim, MD  lidocaine (LIDODERM) 5 % Place 1 patch onto the skin daily. Remove & Discard patch within 12 hours or as directed by MD 10/10/19   Tegeler, Canary Brim, MD  Multiple Vitamin (MULTIVITAMIN) tablet Take 1 tablet by mouth daily.    [provider]    Allergies    Amoxicillin and Codeine  Review of Systems   Review of Systems  Constitutional: Negative for fever.  Musculoskeletal: Negative for joint swelling.  Skin: Negative for color change and rash.  Neurological: Negative for numbness.    Physical Exam Updated Vital Signs BP (!) 151/137   Pulse (!) 104   Temp 98.1 F (36.7 C) (Oral)   Resp 18   Ht 5\' 6"  (1.676 m)   Wt 72.6 kg   LMP 10/07/2019   SpO2 98%   BMI 25.82 kg/m   Physical Exam Vitals and nursing note reviewed.  Constitutional:      General: She is not in acute distress.    Appearance: She is well-developed.  HENT:     Head: Normocephalic and atraumatic.  Eyes:     Conjunctiva/sclera: Conjunctivae normal.  Musculoskeletal:  General: Tenderness present. No swelling or deformity. Normal range of motion.     Cervical back: Neck supple.     Right lower leg: No edema.     Comments: Normal ROM R ankle with no joint laxity, no medial or lateral malleolus swelling or ecchymoses; tenderness along ATFL and along lateral distal lower leg, no calf tenderness or swelling  Skin:    General: Skin is warm and dry.  Neurological:     Mental Status: She is alert and oriented to person, place, and time.  Psychiatric:        Judgment: Judgment normal.     ED Results / Procedures / Treatments   Labs (all labs ordered are listed, but only abnormal results are displayed) Labs Reviewed - No data to display  EKG None  Radiology No results found.  Procedures Procedures (including critical care time)  Medications Ordered in  ED Medications - No data to display  ED Course  I have reviewed the triage vital signs and the nursing notes.      MDM Rules/Calculators/A&P                          PT w/ ongoing pain of ankle, tenderness along ATFL on exam without calf pain or tenderness. Previous DVT US and X rays reviewed and negative. Suspect pain from high ankle sprain. Provided w/ ASO brace, discussed NSAIDs, ice, and elevation. Recommended sports med f/u as needed. Final Clinical Impression(s) / ED Diagnoses Final diagnoses:  Sprain of anterior talofibular ligament of right ankle, subsequent encounter    Rx / DC Orders ED Discharge Orders    None       Domnic Vantol, Ambrose Finland, MD 10/13/19 [phone]

## 2019-10-13 NOTE — ED Triage Notes (Signed)
Pt reports distal RLE pain x 2 weeks that is not responding to rx medication received on 10/6.Tendernes noted. Reports pain worsens at night

## 2019-10-15 ENCOUNTER — Ambulatory Visit: Payer: Self-pay

## 2019-10-15 ENCOUNTER — Encounter: Payer: Self-pay | Admitting: Family Medicine

## 2019-10-15 ENCOUNTER — Other Ambulatory Visit: Payer: Self-pay

## 2019-10-15 ENCOUNTER — Ambulatory Visit (INDEPENDENT_AMBULATORY_CARE_PROVIDER_SITE_OTHER): Payer: 59 | Admitting: Family Medicine

## 2019-10-15 VITALS — BP 138/84 | Ht 66.0 in | Wt 260.0 lb

## 2019-10-15 DIAGNOSIS — S86311A Strain of muscle(s) and tendon(s) of peroneal muscle group at lower leg level, right leg, initial encounter: Secondary | ICD-10-CM

## 2019-10-15 DIAGNOSIS — M25571 Pain in right ankle and joints of right foot: Secondary | ICD-10-CM

## 2019-10-15 NOTE — Progress Notes (Signed)
Colleen Weber - 34 y.o. female MRN [id number]  Date of birth: 03-20-1985  SUBJECTIVE:  Including CC & ROS.  Chief Complaint  Patient presents with  . Ankle Pain    right x 2 weeks    Colleen Weber is a 34 y.o. female that is presenting with right ankle pain.  [place] had an inversion motion of the ankle.  Since that time she has had ongoing lateral lower leg pain.  No history of surgery lower leg.  No improvement with modalities today.  Has tried compression..  Independent review of the right ankle x-ray from 10/6 shows no acute abnormalities.   Review of Systems See HPI   HISTORY: Past Medical, Surgical, Social, and Family History Reviewed & Updated per EMR.   Pertinent Historical Findings include:  Past Medical History:  Diagnosis Date  . Anxiety   . Infertility, female   . Polycystic ovarian syndrome     Past Surgical History:  Procedure Laterality Date  . ANTERIOR CRUCIATE LIGAMENT REPAIR    . NO PAST SURGERIES      Family History  Problem Relation Age of Onset  . Anxiety disorder Mother   . Anxiety disorder Brother     Social History   Socioeconomic History  . Marital status: Married    Spouse name: Not on file  . Number of children: Not on file  . Years of education: Not on file  . Highest education level: Not on file  Occupational History  . Not on file  Tobacco Use  . Smoking status: Never Smoker  . Smokeless tobacco: Never Used  Substance and Sexual Activity  . Alcohol use: Yes    Comment: rarely  . Drug use: No  . Sexual activity: Yes    Birth control/protection: None  Other Topics Concern  . Not on file  Social History Narrative  . Not on file   Social Determinants of Health   Financial Resource Strain:   . Difficulty of Paying Living Expenses: Not on file  Food Insecurity:   . Worried About Programme researcher, broadcasting/film/video in the Last Year: Not on file  . Ran Out of Food in the Last Year: Not on file    Transportation Needs:   . Lack of Transportation (Medical): Not on file  . Lack of Transportation (Non-Medical): Not on file  Physical Activity:   . Days of Exercise per Week: Not on file  . Minutes of Exercise per Session: Not on file  Stress:   . Feeling of Stress : Not on file  Social Connections:   . Frequency of Communication with Friends and Family: Not on file  . Frequency of Social Gatherings with Friends and Family: Not on file  . Attends Religious Services: Not on file  . Active Member of Clubs or Organizations: Not on file  . Attends Banker Meetings: Not on file  . Marital Status: Not on file  Intimate Partner Violence:   . Fear of Current or Ex-Partner: Not on file  . Emotionally Abused: Not on file  . Physically Abused: Not on file  . Sexually Abused: Not on file     PHYSICAL EXAM:  VS: BP 138/84   Ht 5\' 6"  (1.676 m)   Wt 260 lb (117.9 kg)   LMP 10/07/2019   BMI 41.97 kg/m  Physical Exam Gen: NAD, alert, cooperative with exam, well-appearing MSK:  Right ankle: No swelling or ecchymosis. Normal  ankle range of motion. Pain exacerbated with dorsiflexion and eversion. Normal strength resistance. Neurovascularly intact  Limited ultrasound: Right lower leg and ankle:  No effusion of the ankle joint. Normal-appearing ATFL. Normal-appearing peroneal tendons at the lateral malleolus. Normal insertion of the peroneal brevis into the base of the fifth. No changes of the distal fibula. No changes of the syndesmosis. There is hyperemia of the mid belly of the fibularis brevis   Summary: Findings consistent with a muscle strain of the fibularis brevis.  Ultrasound and interpretation by Clare Gandy, MD    ASSESSMENT & PLAN:   Strain of peroneal tendon of right foot Inversion injury two weeks ago. Has changes of the fibularis brevis muscle itself.  -Counseled on home exercise therapy and supportive care. -Counseled on compression. -Could  consider physical therapy or cam walker if needed.

## 2019-10-15 NOTE — Assessment & Plan Note (Signed)
Inversion injury two weeks ago. Has changes of the fibularis brevis muscle itself.  -Counseled on home exercise therapy and supportive care. -Counseled on compression. -Could consider physical therapy or cam walker if needed.

## 2019-10-15 NOTE — Patient Instructions (Signed)
Nice to meet you Please try heat  Please try compression  Please use ibuprofen as needed  Please try the exercises   Please send me a message in MyChart with any questions or updates.  Please see me back in 3 weeks.   --Dr. Jordan Likes

## 2019-11-02 ENCOUNTER — Other Ambulatory Visit: Payer: Self-pay

## 2019-11-02 ENCOUNTER — Encounter (HOSPITAL_BASED_OUTPATIENT_CLINIC_OR_DEPARTMENT_OTHER): Payer: Self-pay | Admitting: Emergency Medicine

## 2019-11-02 ENCOUNTER — Emergency Department (HOSPITAL_BASED_OUTPATIENT_CLINIC_OR_DEPARTMENT_OTHER)
Admission: EM | Admit: 2019-11-02 | Discharge: 2019-11-03 | Disposition: A | Discharge status: Home / Self Care | Payer: 59 | Attending: Emergency Medicine | Admitting: Emergency Medicine

## 2019-11-02 ENCOUNTER — Emergency Department (HOSPITAL_BASED_OUTPATIENT_CLINIC_OR_DEPARTMENT_OTHER): Payer: 59

## 2019-11-02 DIAGNOSIS — R0789 Other chest pain: Secondary | ICD-10-CM | POA: Diagnosis not present

## 2019-11-02 DIAGNOSIS — F419 Anxiety disorder, unspecified: Secondary | ICD-10-CM | POA: Insufficient documentation

## 2019-11-02 DIAGNOSIS — R079 Chest pain, unspecified: Secondary | ICD-10-CM

## 2019-11-02 DIAGNOSIS — R000 Tachycardia, unspecified: Secondary | ICD-10-CM | POA: Diagnosis not present

## 2019-11-02 LAB — CBC WITH DIFFERENTIAL/PLATELET
Abs Immature Granulocytes: 0.05 10*3/uL (ref 0.00–0.07)
Basophils Absolute: 0.1 10*3/uL (ref 0.0–0.1)
Basophils Relative: 1 %
Eosinophils Absolute: 0.6 10*3/uL — ABNORMAL HIGH (ref 0.0–0.5)
Eosinophils Relative: 5 %
HCT: 42.1 % (ref 36.0–46.0)
Hemoglobin: 14.6 g/dL (ref 12.0–15.0)
Immature Granulocytes: 0 %
Lymphocytes Relative: 32 %
Lymphs Abs: 3.8 10*3/uL (ref 0.7–4.0)
MCH: 31.5 pg (ref 26.0–34.0)
MCHC: 34.7 g/dL (ref 30.0–36.0)
MCV: 90.7 fL (ref 80.0–100.0)
Monocytes Absolute: 1.3 10*3/uL — ABNORMAL HIGH (ref 0.1–1.0)
Monocytes Relative: 11 %
Neutro Abs: 6 10*3/uL (ref 1.7–7.7)
Neutrophils Relative %: 51 %
Platelets: 391 10*3/uL (ref 150–400)
RBC: 4.64 MIL/uL (ref 3.87–5.11)
RDW: 12.7 % (ref 11.5–15.5)
WBC: 11.7 10*3/uL — ABNORMAL HIGH (ref 4.0–10.5)
nRBC: 0 % (ref 0.0–0.2)

## 2019-11-02 LAB — BASIC METABOLIC PANEL
Anion gap: 11 (ref 5–15)
BUN: 10 mg/dL (ref 6–20)
CO2: 23 mmol/L (ref 22–32)
Calcium: 9.1 mg/dL (ref 8.9–10.3)
Chloride: 103 mmol/L (ref 98–111)
Creatinine, Ser: 0.76 mg/dL (ref 0.44–1.00)
GFR, Estimated: >60 mL/min (ref >60)
Glucose, Bld: 123 mg/dL — ABNORMAL HIGH (ref 70–99)
Potassium: 3.5 mmol/L (ref 3.5–5.1)
Sodium: 137 mmol/L (ref 135–145)

## 2019-11-02 LAB — TROPONIN I (HIGH SENSITIVITY): Troponin I (High Sensitivity): 3 ng/L (ref <18)

## 2019-11-02 MED ORDER — HYDROXYZINE HCL 25 MG PO TABS
25.0000 mg | ORAL_TABLET | Freq: Once | ORAL | Status: AC
  Administered 2019-11-02: 25 mg via ORAL
  Filled 2019-11-02: qty 1

## 2019-11-02 NOTE — ED Triage Notes (Signed)
Patient arrived via POV c/o chest discomfort with associated anxiety x 1 week. Patient states recent death of family member. Patent states increased blood pressure. Patient is AO x 4, increased HR and BP, normal gait.

## 2019-11-02 NOTE — ED Provider Notes (Signed)
[facility] EMERGENCY DEPARTMENT Provider Note  CSN: [id number] Arrival date & time: 11/02/19 2146    History Chief Complaint  Patient presents with  . Anxiety  . Chest Pain    HPI  Colleen Weber is a 34 y.o. female with history of anxiety is not currently on any anxiety medications reports over the last couple of weeks, since her FIL died, she has had intermittent episodes of sudden onset of racing heart, feeling cold and some chest pressure with anxiety and feeling of impending doom. She had a particularly severe episode this evening prompting her to come to the ED for evaluation.   Past Medical History:  Diagnosis Date  . Anxiety   . Infertility, female   . Polycystic ovarian syndrome     Past Surgical History:  Procedure Laterality Date  . ANTERIOR CRUCIATE LIGAMENT REPAIR    . NO PAST SURGERIES      Family History  Problem Relation Age of Onset  . Anxiety disorder Mother   . Anxiety disorder Brother     Social History   Tobacco Use  . Smoking status: Never Smoker  . Smokeless tobacco: Never Used  Vaping Use  . Vaping Use: Never used  Substance Use Topics  . Alcohol use: Yes    Comment: rarely  . Drug use: No     Home Medications Prior to Admission medications   Medication Sig Start Date End Date Taking? Authorizing Provider  cyclobenzaprine (FLEXERIL) 10 MG tablet Take 1 tablet (10 mg total) by mouth 2 (two) times daily as needed for muscle spasms. 10/10/19   Tegeler, Canary Brim, MD  lidocaine (LIDODERM) 5 % Place 1 patch onto the skin daily. Remove & Discard patch within 12 hours or as directed by MD 10/10/19   Tegeler, Canary Brim, MD  Multiple Vitamin (MULTIVITAMIN) tablet Take 1 tablet by mouth daily.    [provider]     Allergies    Amoxicillin and Codeine   Review of Systems   Review of Systems A comprehensive review of systems was completed and negative except as noted in HPI.    Physical Exam BP (!)  147/124 (BP Location: Left Arm)   Pulse (!) 123   Temp 98.5 F (36.9 C) (Oral)   Resp (!) 25   Ht 5\' 6"  (1.676 m)   Wt 117.9 kg   LMP 10/07/2019 (Exact Date)   SpO2 99%   BMI 41.97 kg/m   Physical Exam Vitals and nursing note reviewed.  Constitutional:      Appearance: Normal appearance.  HENT:     Head: Normocephalic and atraumatic.     Nose: Nose normal.     Mouth/Throat:     Mouth: Mucous membranes are moist.  Eyes:     Extraocular Movements: Extraocular movements intact.     Conjunctiva/sclera: Conjunctivae normal.  Cardiovascular:     Rate and Rhythm: Regular rhythm. Tachycardia present.  Pulmonary:     Effort: Pulmonary effort is normal.     Breath sounds: Normal breath sounds.  Abdominal:     General: Abdomen is flat.     Palpations: Abdomen is soft.     Tenderness: There is no abdominal tenderness.  Musculoskeletal:        General: No swelling. Normal range of motion.     Cervical back: Neck supple.     Right lower leg: No edema.     Left lower leg: No edema.  Skin:    General: Skin  is warm and dry.  Neurological:     General: No focal deficit present.     Mental Status: She is alert.  Psychiatric:        Mood and Affect: Mood normal.      ED Results / Procedures / Treatments   Labs (all labs ordered are listed, but only abnormal results are displayed) Labs Reviewed  BASIC METABOLIC PANEL - Abnormal; Notable for the following components:      Result Value   Glucose, Bld 123 (*)    All other components within normal limits  CBC WITH DIFFERENTIAL/PLATELET - Abnormal; Notable for the following components:   WBC 11.7 (*)    Monocytes Absolute 1.3 (*)    Eosinophils Absolute 0.6 (*)    All other components within normal limits  D-DIMER, QUANTITATIVE (NOT AT [facility])  TROPONIN I (HIGH SENSITIVITY)    EKG EKG Interpretation  Date/Time:  Friday November 02 2019 22:05:53 EDT Ventricular Rate:  135 PR Interval:    QRS Duration: 85 QT  Interval:  313 QTC Calculation: 468 R Axis:   83 Text Interpretation: Sinus tachycardia No significant change since last tracing Confirmed by Susy Frizzle [id number]) on 11/02/2019 10:25:46 PM    Radiology DG Chest 2 View  Result Date: 11/02/2019 CLINICAL DATA:  Chest pain EXAM: CHEST - 2 VIEW COMPARISON:  12/26/2015 FINDINGS: The heart size and mediastinal contours are within normal limits. Both lungs are clear. The visualized skeletal structures are unremarkable. IMPRESSION: No active cardiopulmonary disease. Electronically Signed   By: Jasmine Pang M.D.   On: 11/02/2019 22:55    Procedures Procedures  Medications Ordered in the ED Medications  hydrOXYzine (ATARAX/VISTARIL) tablet 25 mg (25 mg Oral Given 11/02/19 2251)     MDM Rules/Calculators/A&P MDM Patient with anxiety but also noted to be tachycardic with chest pain. PE unlikely but will check dimer as well as CBC, BMP and Trop. CXR. HR improved from triage when she was in exam room.  ED Course  I have reviewed the triage vital signs and the nursing notes.  Pertinent labs & imaging results that were available during my care of the patient were reviewed by me and considered in my medical decision making (see chart for details).  Clinical Course as of Nov 01 2308  Fri Nov 02, 2019  2252 Care of the patient signed out to Dr. Preston Fleeting at the change of shift pending labs and CXR.    [CS]    Clinical Course User Index [CS] Pollyann Savoy, MD    Final Clinical Impression(s) / ED Diagnoses Final diagnoses:  None    Rx / DC Orders ED Discharge Orders    None       Pollyann Savoy, MD 11/02/19 2310

## 2019-11-03 LAB — D-DIMER, QUANTITATIVE: D-Dimer, Quant: 0.28 ug/mL-FEU (ref 0.00–0.50)

## 2019-11-03 LAB — TROPONIN I (HIGH SENSITIVITY): Troponin I (High Sensitivity): 3 ng/L (ref <18)

## 2019-11-03 NOTE — Discharge Instructions (Signed)
Return if you are having any problems. 

## 2019-11-03 NOTE — ED Notes (Signed)
Discharge instructions and follow up care discussed. Pt verbalized understanding.

## 2019-11-03 NOTE — ED Provider Notes (Signed)
Care assumed from Dr. Caffie Damme, patient with chest discomfort and tachycardia pending lab work-up including D-dimer and troponin x2.  Labs are all unremarkable including normal D-dimer and normal troponin x2.  Heart rate has come down.  Patient is reassured regarding test results and given resources to find a primary care provider.  Return precautions discussed.  Results for orders placed or performed during the hospital encounter of 11/02/19  Basic metabolic panel  Result Value Ref Range   Sodium 137 135 - 145 mmol/L   Potassium 3.5 3.5 - 5.1 mmol/L   Chloride 103 98 - 111 mmol/L   CO2 23 22 - 32 mmol/L   Glucose, Bld 123 (H) 70 - 99 mg/dL   BUN 10 6 - 20 mg/dL   Creatinine, Ser 5.00 0.44 - 1.00 mg/dL   Calcium 9.1 8.9 - 37.0 mg/dL   GFR, Estimated >48 >88 mL/min   Anion gap 11 5 - 15  CBC with Differential  Result Value Ref Range   WBC 11.7 (H) 4.0 - 10.5 K/uL   RBC 4.64 3.87 - 5.11 MIL/uL   Hemoglobin 14.6 12.0 - 15.0 g/dL   HCT 91.6 36 - 46 %   MCV 90.7 80.0 - 100.0 fL   MCH 31.5 26.0 - 34.0 pg   MCHC 34.7 30.0 - 36.0 g/dL   RDW 94.5 03.8 - 88.2 %   Platelets 391 150 - 400 K/uL   nRBC 0.0 0.0 - 0.2 %   Neutrophils Relative % 51 %   Neutro Abs 6.0 1.7 - 7.7 K/uL   Lymphocytes Relative 32 %   Lymphs Abs 3.8 0.7 - 4.0 K/uL   Monocytes Relative 11 %   Monocytes Absolute 1.3 (H) 0.1 - 1.0 K/uL   Eosinophils Relative 5 %   Eosinophils Absolute 0.6 (H) 0.0 - 0.5 K/uL   Basophils Relative 1 %   Basophils Absolute 0.1 0.0 - 0.1 K/uL   Immature Granulocytes 0 %   Abs Immature Granulocytes 0.05 0.00 - 0.07 K/uL  D-dimer, quantitative (not at [facility])  Result Value Ref Range   D-Dimer, Quant 0.28 0.00 - 0.50 ug/mL-FEU  Troponin I (High Sensitivity)  Result Value Ref Range   Troponin I (High Sensitivity) 3 <18 ng/L  Troponin I (High Sensitivity)  Result Value Ref Range   Troponin I (High Sensitivity) 3 <18 ng/L   DG Chest 2 View  Result Date: 11/02/2019 CLINICAL DATA:  Chest  pain EXAM: CHEST - 2 VIEW COMPARISON:  12/26/2015 FINDINGS: The heart size and mediastinal contours are within normal limits. Both lungs are clear. The visualized skeletal structures are unremarkable. IMPRESSION: No active cardiopulmonary disease. Electronically Signed   By: Jasmine Pang M.D.   On: 11/02/2019 22:55     Dione Booze, MD 11/03/19 0127

## 2019-11-04 ENCOUNTER — Encounter: Payer: Self-pay | Admitting: Family Medicine

## 2019-11-05 ENCOUNTER — Ambulatory Visit: Payer: 59 | Admitting: Family Medicine

## 2020-12-09 ENCOUNTER — Emergency Department (HOSPITAL_BASED_OUTPATIENT_CLINIC_OR_DEPARTMENT_OTHER)
Admission: EM | Admit: 2020-12-09 | Discharge: 2020-12-09 | Disposition: A | Discharge status: Home / Self Care | Payer: BC Managed Care – PPO | Attending: Emergency Medicine | Admitting: Emergency Medicine

## 2020-12-09 ENCOUNTER — Emergency Department (HOSPITAL_BASED_OUTPATIENT_CLINIC_OR_DEPARTMENT_OTHER): Payer: BC Managed Care – PPO

## 2020-12-09 ENCOUNTER — Encounter (HOSPITAL_BASED_OUTPATIENT_CLINIC_OR_DEPARTMENT_OTHER): Payer: Self-pay

## 2020-12-09 DIAGNOSIS — R0789 Other chest pain: Secondary | ICD-10-CM | POA: Insufficient documentation

## 2020-12-09 DIAGNOSIS — Y92410 Unspecified street and highway as the place of occurrence of the external cause: Secondary | ICD-10-CM | POA: Insufficient documentation

## 2020-12-09 MED ORDER — IBUPROFEN 800 MG PO TABS
800.0000 mg | ORAL_TABLET | Freq: Once | ORAL | Status: AC
  Administered 2020-12-09: 800 mg via ORAL
  Filled 2020-12-09: qty 1
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
## 2022-02-25 IMAGING — US US EXTREM LOW VENOUS*R*
1 series · 14 of 24 positions shown · non-contrast
Comparison: None.

CLINICAL DATA: Right calf and leg pain

EXAM:
RIGHT LOWER EXTREMITY VENOUS DOPPLER ULTRASOUND
TECHNIQUE: Gray-scale sonography with compression, as well as color and duplex
ultrasound, were performed to evaluate the deep venous system(s)
from the level of the common femoral vein through the popliteal and
proximal calf veins.

[Series 1: us extrem low venous*right* · 14 of 37 slices shown]
[im 1/37]
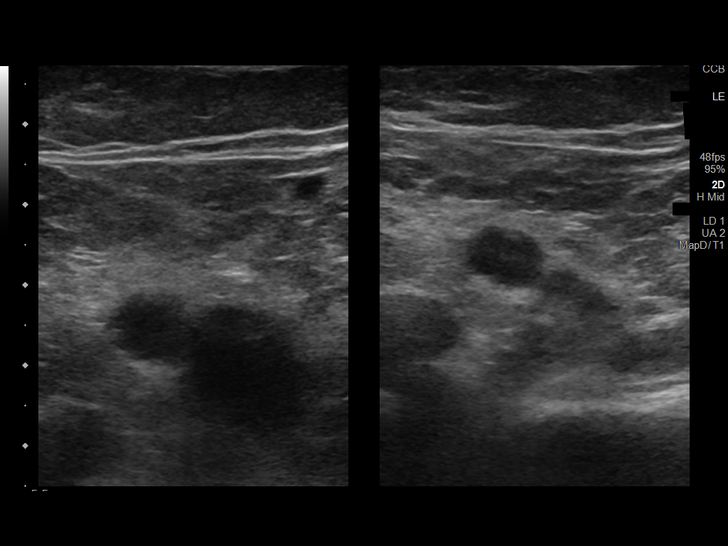
[im 4/37]
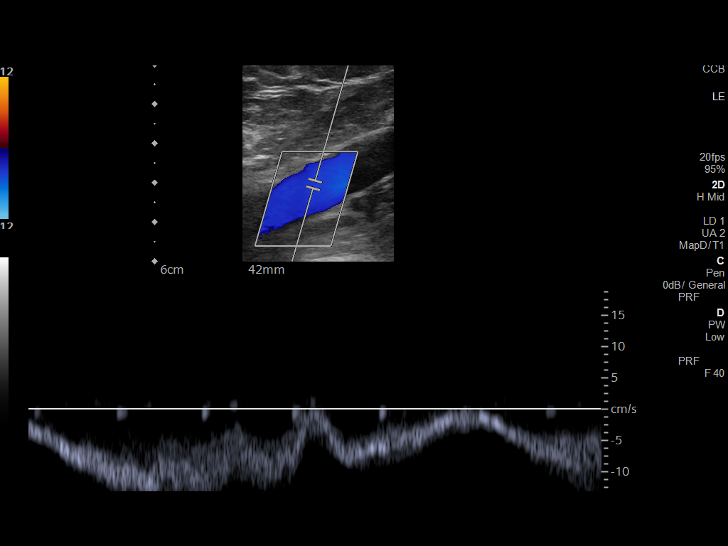
[im 7/37]
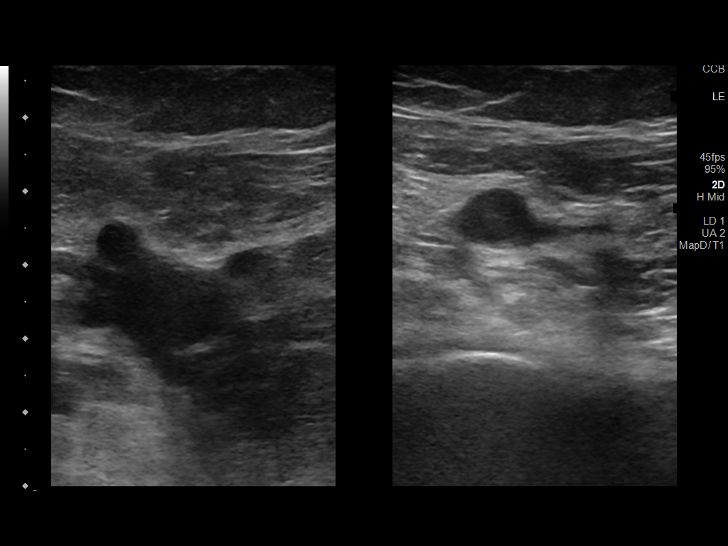
[im 10/37]
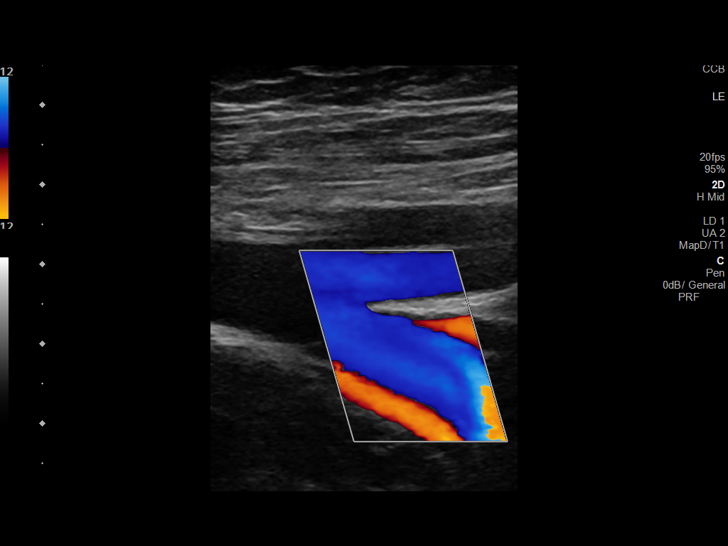
[im 11/37]
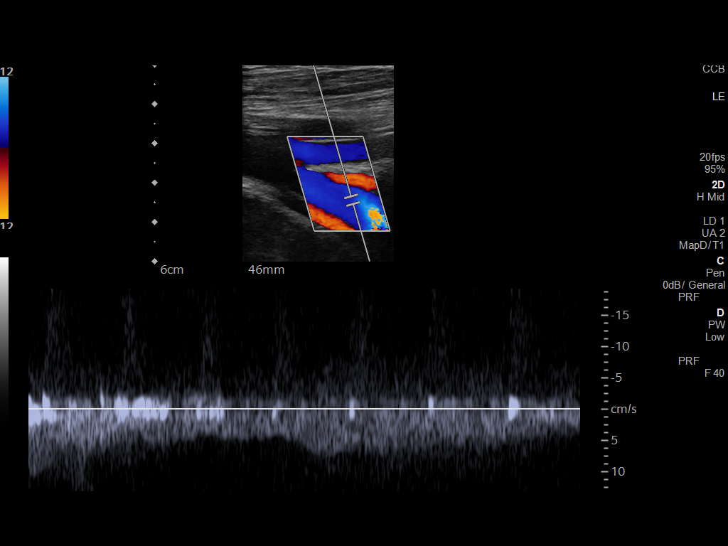
[im 15/37]
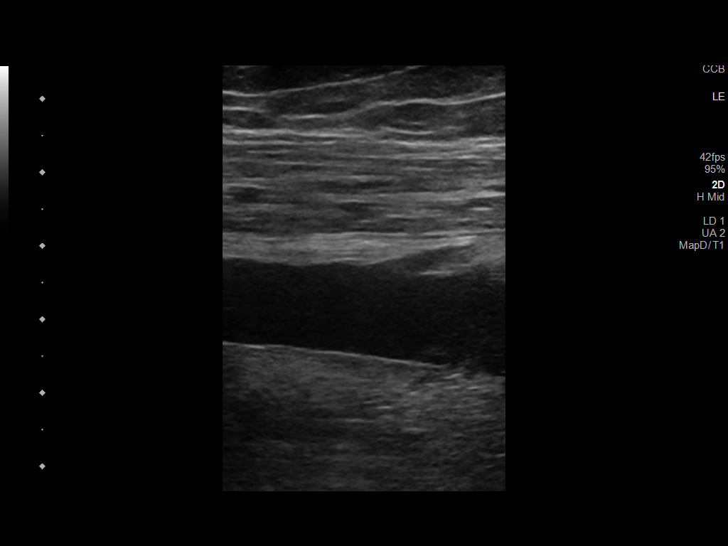
[im 18/37]
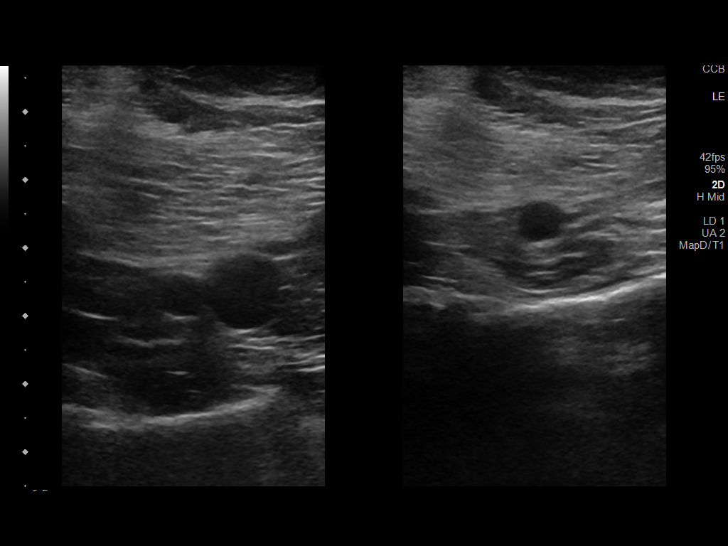
[im 19/37]
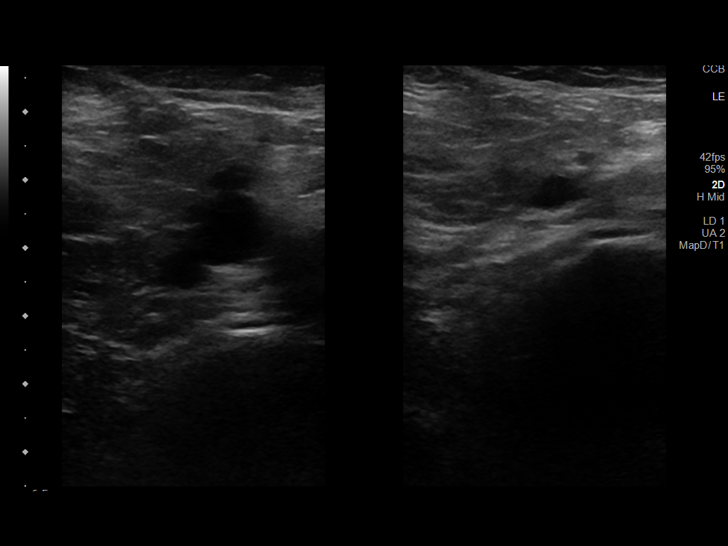
[im 22/37]
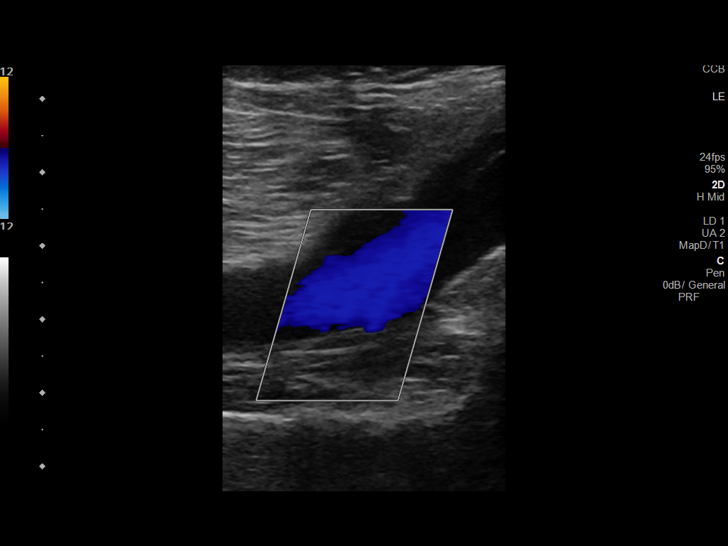
[im 26/37]
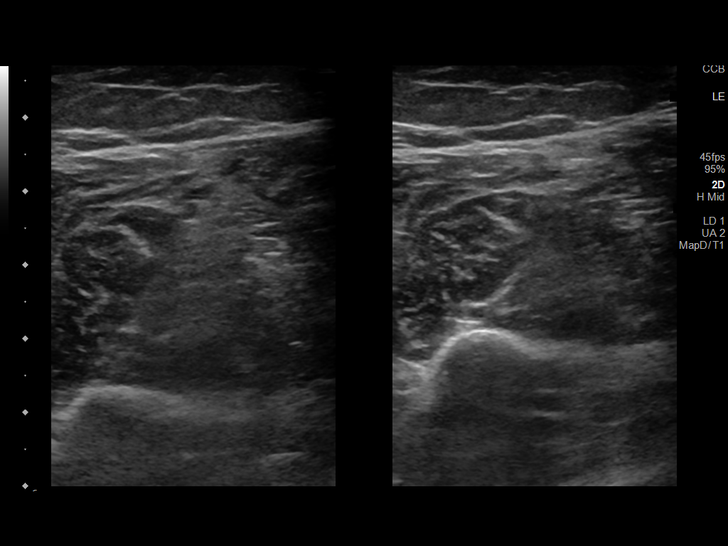
[im 29/37]
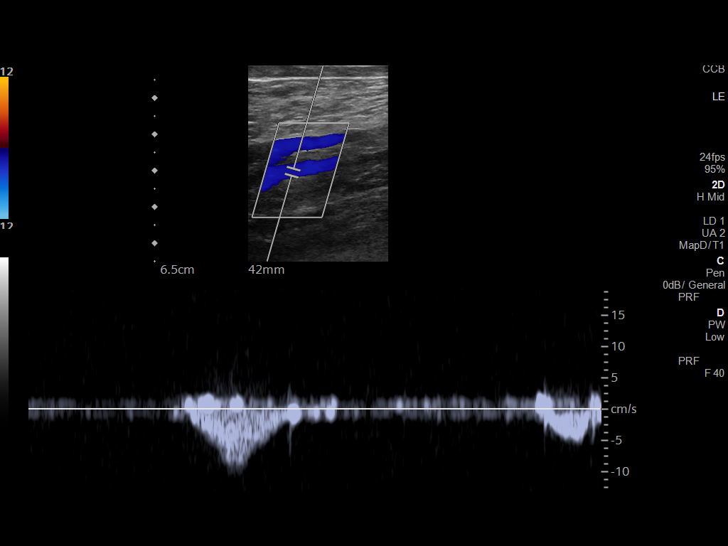
[im 30/37]
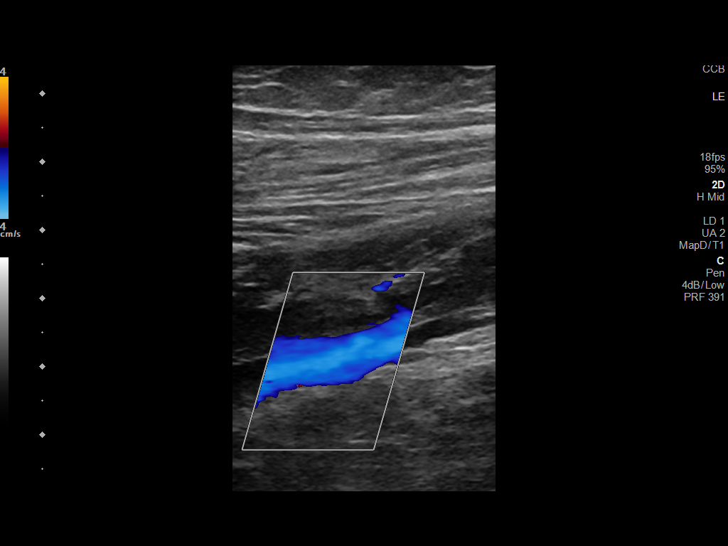
[im 33/37]
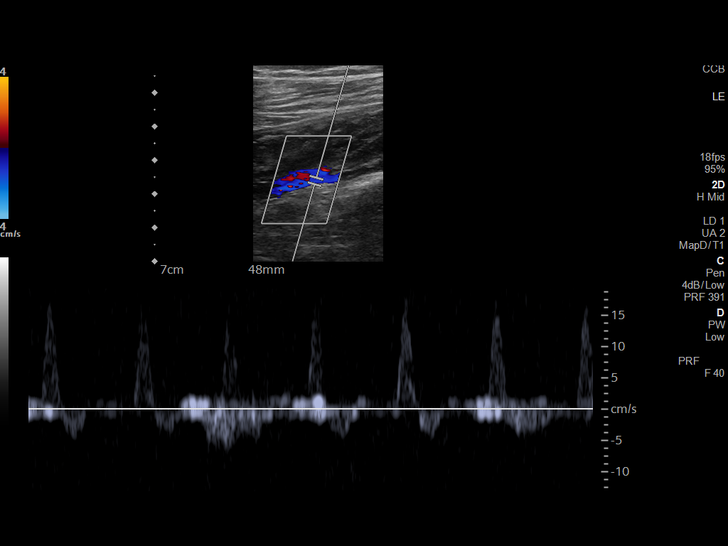
[im 37/37]
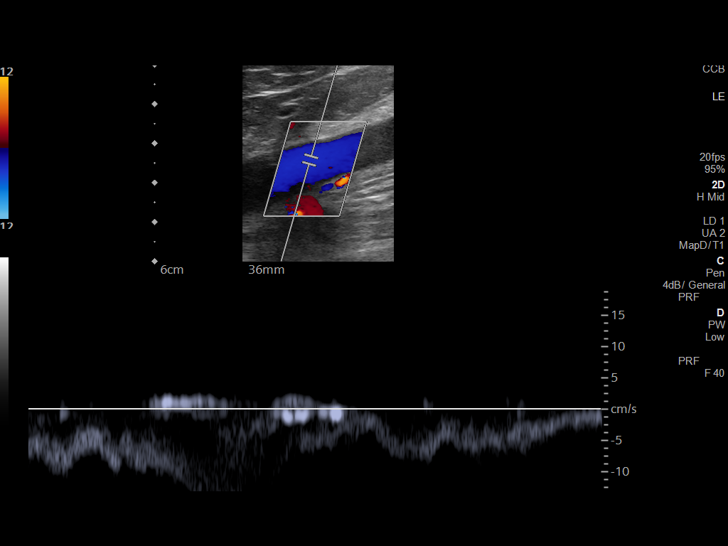

[14 of 24 positions shown; findings below may reference images not displayed]

FINDINGS: VENOUS

Normal compressibility of the common femoral, superficial femoral,
and popliteal veins, as well as the visualized calf veins.
Visualized portions of profunda femoral vein and great saphenous
vein unremarkable. No filling defects to suggest DVT on grayscale or
color Doppler imaging. Doppler waveforms show normal direction of
venous flow, normal respiratory plasticity and response to
augmentation.

Limited views of the contralateral common femoral vein are
unremarkable.

OTHER

None.

Limitations: none
IMPRESSION: No evidence of right lower extremity DVT.

## 2022-04-19 ENCOUNTER — Encounter: Payer: Self-pay | Admitting: *Deleted
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
